# Patient Record
Sex: Female | Born: 1990 | Race: Black or African American | Hispanic: No | Marital: Single | Smoking: Never smoker
Health system: Southern US, Community
[De-identification: ages and names within clinical notes are randomized; demographics above are authoritative.]

## PROBLEM LIST (undated history)

## (undated) ENCOUNTER — Inpatient Hospital Stay (HOSPITAL_COMMUNITY): Payer: Self-pay

## (undated) ENCOUNTER — Ambulatory Visit (HOSPITAL_COMMUNITY): Admission: EM | Source: Home / Self Care

## (undated) ENCOUNTER — Emergency Department (HOSPITAL_COMMUNITY): Admission: EM | Payer: Medicaid Other | Source: Home / Self Care

## (undated) DIAGNOSIS — Z789 Other specified health status: Secondary | ICD-10-CM

## (undated) DIAGNOSIS — A749 Chlamydial infection, unspecified: Secondary | ICD-10-CM

## (undated) HISTORY — DX: Chlamydial infection, unspecified: A74.9

## (undated) HISTORY — PX: OTHER SURGICAL HISTORY: SHX169

## (undated) HISTORY — PX: DENTAL SURGERY: SHX609

---
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
## 2011-01-09 LAB — RAPID STREP SCREEN (MED CTR MEBANE ONLY): Streptococcus, Group A Screen (Direct): NEGATIVE

## 2011-04-16 ENCOUNTER — Inpatient Hospital Stay (INDEPENDENT_AMBULATORY_CARE_PROVIDER_SITE_OTHER)
Admission: RE | Admit: 2011-04-16 | Discharge: 2011-04-16 | Disposition: A | Discharge status: Home / Self Care | Payer: Self-pay | Source: Ambulatory Visit | Attending: Emergency Medicine | Admitting: Emergency Medicine

## 2011-04-16 DIAGNOSIS — L719 Rosacea, unspecified: Secondary | ICD-10-CM

## 2011-07-19 ENCOUNTER — Inpatient Hospital Stay (HOSPITAL_COMMUNITY)
Admission: RE | Admit: 2011-07-19 | Discharge: 2011-07-19 | Disposition: A | Discharge status: Home / Self Care | Payer: Medicaid Other | Source: Ambulatory Visit | Attending: Emergency Medicine | Admitting: Emergency Medicine

## 2011-08-25 ENCOUNTER — Encounter: Payer: Self-pay | Admitting: *Deleted

## 2011-08-25 ENCOUNTER — Emergency Department (INDEPENDENT_AMBULATORY_CARE_PROVIDER_SITE_OTHER)
Admission: EM | Admit: 2011-08-25 | Discharge: 2011-08-25 | Disposition: A | Discharge status: Home / Self Care | Payer: Medicaid Other | Source: Home / Self Care | Attending: Family Medicine | Admitting: Family Medicine

## 2011-08-25 DIAGNOSIS — S21009A Unspecified open wound of unspecified breast, initial encounter: Secondary | ICD-10-CM

## 2011-08-25 MED ORDER — AMOXICILLIN-POT CLAVULANATE 875-125 MG PO TABS: 1.0000 | ORAL_TABLET | Freq: Two times a day (BID) | ORAL | Status: AC
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
## 2012-03-06 ENCOUNTER — Encounter (HOSPITAL_COMMUNITY): Payer: Self-pay | Admitting: Emergency Medicine

## 2012-03-06 ENCOUNTER — Emergency Department (HOSPITAL_COMMUNITY)
Admission: EM | Admit: 2012-03-06 | Discharge: 2012-03-06 | Disposition: A | Discharge status: Home / Self Care | Payer: Medicaid Other | Attending: Emergency Medicine | Admitting: Emergency Medicine

## 2012-03-06 DIAGNOSIS — L0291 Cutaneous abscess, unspecified: Secondary | ICD-10-CM

## 2012-03-06 DIAGNOSIS — L0231 Cutaneous abscess of buttock: Secondary | ICD-10-CM | POA: Insufficient documentation

## 2012-03-06 MED ORDER — OXYCODONE-ACETAMINOPHEN 5-325 MG PO TABS: 2.0000 | ORAL_TABLET | Freq: Once | ORAL | Status: AC

## 2012-03-06 MED ORDER — CEPHALEXIN 500 MG PO CAPS: 500.0000 mg | ORAL_CAPSULE | Freq: Four times a day (QID) | ORAL | Status: AC

## 2012-03-06 MED ORDER — CEPHALEXIN 250 MG PO CAPS
500.0000 mg | ORAL_CAPSULE | Freq: Once | ORAL | Status: AC
  Administered 2012-03-06: 500 mg via ORAL
  Filled 2012-03-06: qty 2

## 2012-03-06 MED ORDER — OXYCODONE-ACETAMINOPHEN 5-325 MG PO TABS
2.0000 | ORAL_TABLET | Freq: Once | ORAL | Status: AC
  Administered 2012-03-06: 2 via ORAL
  Filled 2012-03-06 (×2): qty 1

## 2012-03-06 NOTE — ED Notes (Signed)
PT has swollen area size of a quarter on left buttock. Pt reports tender and very painful. No drainage noted.

## 2012-03-06 NOTE — ED Notes (Signed)
Pt feeling better

## 2012-03-06 NOTE — ED Provider Notes (Signed)
History     CSN: [id number]  Arrival date & time 03/06/12  0018   None     Chief Complaint  Patient presents with  . Abscess    (Consider location/radiation/quality/duration/timing/severity/associated sxs/prior treatment) HPI Comments: Patient with 3, days of painful right buttock surrounding erythema and no discrete bump or pimple has been taking over-the-counter ibuprofen, without relief.  Sitting, or palpation, increases, the pain and discomfort.  She has not had any trouble with ambulation.  Denies fever, or myalgias  Patient is a 21 y.o. female presenting with abscess. The history is provided by the patient.  Abscess  This is a new problem. The abscess is present on the right buttock. The problem is moderate. The abscess is characterized by redness and painfulness. The patient was exposed to OTC medications. The abscess first occurred at home. Pertinent negatives include no fever.    History reviewed. No pertinent past medical history.  History reviewed. No pertinent past surgical history.  No family history on file.  History  Substance Use Topics  . Smoking status: Not on file  . Smokeless tobacco: Not on file  . Alcohol Use: Yes     occasional    OB History    Grav Para Term Preterm Abortions TAB SAB Ect Mult Living                  Review of Systems  Constitutional: Negative for fever.  Musculoskeletal: Negative for myalgias.  Skin: Positive for wound.  Neurological: Negative for dizziness.    Allergies  Review of patient's allergies indicates no known allergies.  Home Medications   Current Outpatient Rx  Name Route Sig Dispense Refill  . ORTHO TRI-CYCLEN (28) PO Oral Take by mouth daily.        BP 112/90  Pulse 100  Temp(Src) 98.7 F (37.1 C) (Oral)  Resp 18  SpO2 98%  LMP 02/25/2012  Physical Exam  Constitutional: She appears well-developed and well-nourished.  HENT:  Head: Normocephalic.  Eyes: Pupils are equal, round, and reactive to  light.  Neck: Normal range of motion.  Cardiovascular: Normal rate.   Pulmonary/Chest: Effort normal.  Musculoskeletal: Normal range of motion.  Skin:       3 cm, oval on the right mid buttock area.  That is firm.  No discrete central lesion    ED Course  INCISION AND DRAINAGE Date/Time: 03/06/2012 2:44 AM Performed by: Arman Filter Authorized by: Arman Filter Consent: Verbal consent obtained. Risks and benefits: risks, benefits and alternatives were discussed Consent given by: patient Patient understanding: patient states understanding of the procedure being performed Patient identity confirmed: verbally with patient Time out: Immediately prior to procedure a "time out" was called to verify the correct patient, procedure, equipment, support staff and site/side marked as required. Type: abscess Body area: anogenital Location details: gluteal cleft Anesthesia: local infiltration Local anesthetic: lidocaine 2% without epinephrine Anesthetic total: 2 ml Scalpel size: 11 Needle gauge: 22 Incision type: single straight Complexity: simple Drainage: purulent Drainage amount: moderate Packing material: 1/4 in iodoform gauze Patient tolerance: Patient tolerated the procedure well with no immediate complications.   (including critical care time)  Labs Reviewed - No data to display No results found.   No diagnosis found.    MDM   Abscess of the right        Arman Filter, NP 03/06/12 0246  Arman Filter, NP 03/06/12 0246

## 2012-03-06 NOTE — ED Notes (Signed)
PT. REPORTS ABSCESS AT RIGHT BUTTOCK WITH NO DRAINAGE ONSET 3 DAYS AGO.

## 2012-03-06 NOTE — ED Notes (Signed)
Patient is resting comfortably. 

## 2012-03-06 NOTE — Discharge Instructions (Signed)
Abscess  Care After  An abscess (also called a boil or furuncle) is an infected area that contains a collection of pus. Signs and symptoms of an abscess include pain, tenderness, redness, or hardness, or you may feel a moveable soft area under your skin. An abscess can occur anywhere in the body. The infection may spread to surrounding tissues causing cellulitis. A cut (incision) by the surgeon was made over your abscess and the pus was drained out. Gauze may have been packed into the space to provide a drain that will allow the cavity to heal from the inside outwards. The boil may be painful for 5 to 7 days. Most people with a boil do not have high fevers. Your abscess, if seen early, may not have localized, and may not have been lanced. If not, another appointment may be required for this if it does not get better on its own or with medications.  HOME CARE INSTRUCTIONS     Only take over-the-counter or prescription medicines for pain, discomfort, or fever as directed by your caregiver.    When you bathe, soak and then remove gauze or iodoform packs at least daily or as directed by your caregiver. You may then wash the wound gently with mild soapy water. Repack with gauze or do as your caregiver directs.   SEEK IMMEDIATE MEDICAL CARE IF:     You develop increased pain, swelling, redness, drainage, or bleeding in the wound site.    You develop signs of generalized infection including muscle aches, chills, fever, or a general ill feeling.    An oral temperature above 102 F (38.9 C) develops, not controlled by medication.   See your caregiver for a recheck if you develop any of the symptoms described above. If medications (antibiotics) were prescribed, take them as directed.  Document Released: 04/04/2005 Document Revised: 09/05/2011 Document Reviewed: 11/30/2007  ExitCare Patient Information 2012 [employer].

## 2012-03-06 NOTE — ED Notes (Signed)
PT ambulated with a  Steady gait; VSS; A&Ox3; no signs of distress; respirations even and unlabored; skin warm and dry; no questions at this time.  

## 2012-03-06 NOTE — ED Provider Notes (Signed)
Medical screening examination/treatment/procedure(s) were performed by non-physician practitioner and as supervising physician I was immediately available for consultation/collaboration.   Lyanne Co, MD 03/06/12 [phone]

## 2012-03-07 ENCOUNTER — Emergency Department (HOSPITAL_COMMUNITY)
Admission: EM | Admit: 2012-03-07 | Discharge: 2012-03-07 | Disposition: A | Discharge status: Home / Self Care | Payer: Medicaid Other | Attending: Emergency Medicine | Admitting: Emergency Medicine

## 2012-03-07 ENCOUNTER — Encounter (HOSPITAL_COMMUNITY): Payer: Self-pay | Admitting: Emergency Medicine

## 2012-03-07 DIAGNOSIS — Z79899 Other long term (current) drug therapy: Secondary | ICD-10-CM | POA: Insufficient documentation

## 2012-03-07 DIAGNOSIS — Z792 Long term (current) use of antibiotics: Secondary | ICD-10-CM | POA: Insufficient documentation

## 2012-03-07 DIAGNOSIS — L0291 Cutaneous abscess, unspecified: Secondary | ICD-10-CM

## 2012-03-07 DIAGNOSIS — L03317 Cellulitis of buttock: Secondary | ICD-10-CM | POA: Insufficient documentation

## 2012-03-07 DIAGNOSIS — L0231 Cutaneous abscess of buttock: Secondary | ICD-10-CM | POA: Insufficient documentation

## 2012-03-07 DIAGNOSIS — Z09 Encounter for follow-up examination after completed treatment for conditions other than malignant neoplasm: Secondary | ICD-10-CM | POA: Insufficient documentation

## 2012-03-07 NOTE — Discharge Instructions (Signed)
Soak in a warm tub, 3-4 times a day for 20 minutes. After that clean the area well with soap and water. Continue this until the wound is completely healed. Take the antibiotics until they are gone. See the Dr. of your choice for problems.  Abscess An abscess (boil or furuncle) is an infected area that contains a collection of pus.  SYMPTOMS Signs and symptoms of an abscess include pain, tenderness, redness, or hardness. You may feel a moveable soft area under your skin. An abscess can occur anywhere in the body.  TREATMENT  A surgical cut (incision) may be made over your abscess to drain the pus. Gauze may be packed into the space or a drain may be looped through the abscess cavity (pocket). This provides a drain that will allow the cavity to heal from the inside outwards. The abscess may be painful for a few days, but should feel much better if it was drained.  Your abscess, if seen early, may not have localized and may not have been drained. If not, another appointment may be required if it does not get better on its own or with medications. HOME CARE INSTRUCTIONS   Only take over-the-counter or prescription medicines for pain, discomfort, or fever as directed by your caregiver.   Take your antibiotics as directed if they were prescribed. Finish them even if you start to feel better.   Keep the skin and clothes clean around your abscess.   If the abscess was drained, you will need to use gauze dressing to collect any draining pus. Dressings will typically need to be changed 3 or more times a day.   The infection may spread by skin contact with others. Avoid skin contact as much as possible.   Practice good hygiene. This includes regular hand washing, cover any draining skin lesions, and do not share personal care items.   If you participate in sports, do not share athletic equipment, towels, whirlpools, or personal care items. Shower after every practice or tournament.   If a draining  area cannot be adequately covered:   Do not participate in sports.   Children should not participate in day care until the wound has healed or drainage stops.   If your caregiver has given you a follow-up appointment, it is very important to keep that appointment. Not keeping the appointment could result in a much worse infection, chronic or permanent injury, pain, and disability. If there is any problem keeping the appointment, you must call back to this facility for assistance.  SEEK MEDICAL CARE IF:   You develop increased pain, swelling, redness, drainage, or bleeding in the wound site.   You develop signs of generalized infection including muscle aches, chills, fever, or a general ill feeling.   You have an oral temperature above 102 F (38.9 C).  MAKE SURE YOU:   Understand these instructions.   Will watch your condition.   Will get help right away if you are not doing well or get worse.  Document Released: 06/26/2005 Document Revised: 09/05/2011 Document Reviewed: 04/19/2008 [facility] Patient Information 2012 [place], [place].   RESOURCE GUIDE  Chronic Pain Problems: Contact [facility]  [phone] Patients need to be referred by their primary care doctor.  Insufficient Money for Medicine: Contact United Way:  call "211" or [facility] 346-764-3687.  No Primary Care Doctor: - Call [facility] Connect  [phone] - can help you locate a primary care doctor that  accepts your insurance, provides certain  services, etc. - Physician Referral [phone]  Agencies that provide inexpensive medical care: - [facility]  [phone] - [facility]  [phone] - [facility]  [phone] [facility]  [phone] - [facility]  [phone] [facility]  [phone]  Medicaid-accepting [facility] Providers: - [facility]- [address]  [phone], Mon-Fri  9am-7pm, Sat 9am-1pm - [facility]- [address] [place], [address] [place]  [phone] - [facility]- [address], [address] [place]  [phone] [facility]- [address]  [phone] - Renaye Rakers- [address] [place], [address], [phone]  Only accepts [place] patients after they have their name  applied to their card  Self Pay (no insurance) in [place]: - Sickle Cell Patients: Dr Willey Blade, [facility]  [address] [place], [phone] - [facility]- [address] [place]  [phone]       [facility]- [address]       -     [facility]- see information above (Speak to [employer] if you do not have insurance)       -  [facility]- [address] [place], [phone]       -  [facility]- [address],  [phone]       -  [facility]- [address], [phone]       -  Dr Julio Sicks-  [address], [place], [phone]       -  [facility]- [address], [phone]       -  [facility]- [address], [phone], also [address], [phone]       -    [facility]- [address] [place], [phone], 1st & 3rd Saturday   every month, 10am-1pm  1) Find a Doctor and Pay Out of Pocket Although you won't have to find out who is covered by your insurance plan, it is a good idea to ask around and get recommendations. You will then need to call the office and see if the doctor you have chosen will accept you as a new patient and what types of options they offer for patients who are self-pay. Some doctors offer discounts or will set up payment plans for their patients who do not have insurance, but you will need to ask so you aren't surprised when you get to your appointment.  2) Contact Your Local Health Department Not all health departments have doctors that can see  patients for sick visits, but many do, so it is worth a call to see if yours does. If you don't know where your local health department is, you can check in your phone book. The CDC also has a tool to help you locate your state's health department, and many state websites also have listings of all of their local health departments.  3) Find a Walk-in Clinic If your illness is not likely to be very severe or complicated, you may want to try a walk in clinic. These are popping up all over the country in pharmacies, drugstores, and shopping centers. They're usually staffed by nurse practitioners or physician assistants that have been trained to treat common illnesses  and complaints. They're usually fairly quick and inexpensive. However, if you have serious medical issues or chronic medical problems, these are probably not your best option  STD Testing - [facility] Department of [facility] [place], STD Clinic, [address] [place], phone [phone] or [phone].  Monday - Friday, call for an appointment. [facility] Department of [employer], STD Clinic, [address], [place], phone [phone] or [phone].  Monday - Friday, call for an appointment.  Abuse/Neglect: [facility] Child Abuse Hotline [phone] [facility] Child Abuse Hotline 941-719-8420 (After Hours)  Emergency Shelter:  [facility] [phone]  Maternity Homes: - [facility] [phone]) [phone] - [facility] [phone]  MRSA Hotline #:   2150995169  [facility] Resources  [facility] [place]  [employer] [facility] Dept. [address]                 [address]         [address] [place] [address]  [place]ondell Reveal Phone:  [phone]                                  Phone:  [phone]                    Phone:  [phone]  [facility] Mental Health, [phone] - [phone]       -     [facility] in [place], [address]                                  (916)742-0526, [facility] Child Abuse Hotline 331-875-3653 or (708)887-5880 (After Hours)   Behavioral Health Services  Substance Abuse Resources: - Alcohol and Drug Services  646-564-0860 - [facility] [phone] - [facility] [place] [phone] Floydene Flock [phone] - Residential & Outpatient Substance Abuse Program  [phone]  Psychological Services: [facility]  [phone] Services  7204939816 - [facility], [phone] [place]. [address], [place], ACCESS LINE: 947-042-0880 or 816-472-8861, [employer]  Dental Assistance  If unable to pay or uninsured, contact:  [facility] or [facility]. to become qualified for the adult dental clinic.  Patients with Medicaid: [facility] [phone] [address], [phone] [address] [phone]  If unable to pay, or uninsured, contact [facility] 603-573-2852) or [facility] Department [phone] in [place], [phone] in [facility]) to become qualified for the adult dental clinic  Other Low-Cost Community Dental Services: - [facility]- [address] [place], [place], [place], [address], [phone], [phone], 2nd and 4th Thursday of the month at 6:30am.  10 clients each day by appointment, can sometimes see walk-in  patients if someone does not show for an appointment. [facility]- [address] [place], [place], [address], [phone] - [facility]- [address], [place], [place], [address], [phone] - [place] [facility]- [phone] [facility] Department-  [phone] [facility] Department- [phone]

## 2012-03-07 NOTE — ED Provider Notes (Signed)
History   This chart was scribed for Flint Melter, MD by Shari Heritage. The patient was seen in room STRE4/STRE4. Patient's care was started at 1107.     CSN: [id number]  Arrival date & time 03/07/12  1107   First MD Initiated Contact with Patient 03/07/12 1136      Chief Complaint  Patient presents with  . Wound Check    (Consider location/radiation/quality/duration/timing/severity/associated sxs/prior treatment) Patient is a 21 y.o. female presenting with wound check. The history is provided by the patient. No language interpreter was used.  Wound Check    Patricia Villanueva is a 21 y.o.  who presents to the Emergency Department complaining of moderate pain after a boil on her buttocks was incised and drained. A physician had placed a wick at the area of the abscess and wound has been draining for 2 days. Patient has been taking Percocet and antibiotics, but still experiences pain. Patient reports no other pertinent medical or surgical history.    History reviewed. No pertinent past medical history.  History reviewed. No pertinent past surgical history.  No family history on file.  History  Substance Use Topics  . Smoking status: Not on file  . Smokeless tobacco: Not on file  . Alcohol Use: Yes     occasional    OB History    Grav Para Term Preterm Abortions TAB SAB Ect Mult Living                  Review of Systems A complete 10 system review of systems was obtained and all systems are negative except as noted in the HPI and PMH.   Allergies  Review of patient's allergies indicates no known allergies.  Home Medications   Current Outpatient Rx  Name Route Sig Dispense Refill  . IBUPROFEN 600 MG PO TABS Oral Take 600 mg by mouth every 6 (six) hours as needed. For cramps    . NORGESTIM-ETH ESTRAD TRIPHASIC 0.18/0.215/0.25 MG-35 MCG PO TABS Oral Take 1 tablet by mouth daily.    . OXYCODONE-ACETAMINOPHEN 5-325 MG PO TABS Oral Take 1 tablet by mouth every 6 (six)  hours as needed. For pain    . CEPHALEXIN 500 MG PO CAPS Oral Take 1 capsule (500 mg total) by mouth 4 (four) times daily. 28 capsule 0  . OXYCODONE-ACETAMINOPHEN 5-325 MG PO TABS Oral Take 2 tablets by mouth once. 20 tablet 0    BP 96/44  Pulse 82  Temp(Src) 98 F (36.7 C) (Oral)  Resp 16  SpO2 100%  LMP 02/25/2012  Physical Exam  Nursing note and vitals reviewed. Constitutional: She is oriented to person, place, and time. She appears well-developed and well-nourished. No distress.  HENT:  Head: Normocephalic and atraumatic.  Eyes: Conjunctivae and EOM are normal.  Neck: Neck supple. No tracheal deviation present.  Cardiovascular: Normal rate.   Pulmonary/Chest: Effort normal. No respiratory distress.  Abdominal: She exhibits no distension.  Musculoskeletal: Normal range of motion.  Neurological: She is alert and oriented to person, place, and time. No sensory deficit.  Skin: Skin is dry.       Removed 2.5in wick from wound. Wound is draining mild purulence and there is surrounding induration of 2cm. Area is mildly tender.  Psychiatric: She has a normal mood and affect. Her behavior is normal.    ED Course  Procedures (including critical care time)  COORDINATION OF CARE: 11:50AM - Patient informed of current plan for treatment and evaluation and agrees with  plan at this time. Suggested that patient soak in a bath to release wick and sooth area. Also reccommended that patient continue to take pain medications and antibiotics.    Labs Reviewed - No data to display No results found.   1. Abscess       MDM  Healing, buttocks, abscess, no indication for recurrent packing. Doubt cellulitis. Doubt metabolic instability, serious bacterial infection or impending vascular collapse; the patient is stable for discharge.   Plan: Home Medications- keflex; Home Treatments- heat; Recommended follow up- PCP prn   I personally performed the services described in this  documentation, which was scribed in my presence. The recorded information has been reviewed and considered.  [id number]  Flint Melter, MD 03/07/12 2037

## 2012-03-07 NOTE — ED Notes (Signed)
Pt. Stated, I'm here to remove the packing from my bottom

## 2012-03-18 ENCOUNTER — Other Ambulatory Visit: Payer: Self-pay

## 2012-03-18 ENCOUNTER — Emergency Department (HOSPITAL_COMMUNITY)
Admission: EM | Admit: 2012-03-18 | Discharge: 2012-03-18 | Disposition: A | Discharge status: Home / Self Care | Payer: Medicaid Other | Attending: Emergency Medicine | Admitting: Emergency Medicine

## 2012-03-18 ENCOUNTER — Encounter (HOSPITAL_COMMUNITY): Payer: Self-pay

## 2012-03-18 ENCOUNTER — Emergency Department (HOSPITAL_COMMUNITY): Payer: Medicaid Other

## 2012-03-18 DIAGNOSIS — S41109A Unspecified open wound of unspecified upper arm, initial encounter: Secondary | ICD-10-CM | POA: Insufficient documentation

## 2012-03-18 DIAGNOSIS — S41112A Laceration without foreign body of left upper arm, initial encounter: Secondary | ICD-10-CM

## 2012-03-18 LAB — URINALYSIS, ROUTINE W REFLEX MICROSCOPIC
Glucose, UA: NEGATIVE mg/dL
Leukocytes, UA: NEGATIVE
Protein, ur: NEGATIVE mg/dL
Specific Gravity, Urine: 1.013 (ref 1.005–1.030)
Urobilinogen, UA: 0.2 mg/dL (ref 0.0–1.0)

## 2012-03-18 LAB — BASIC METABOLIC PANEL
CO2: 23 mEq/L (ref 19–32)
Calcium: 9.2 mg/dL (ref 8.4–10.5)
GFR calc non Af Amer: >90 mL/min (ref >90)
Potassium: 4.3 mEq/L (ref 3.5–5.1)
Sodium: 139 mEq/L (ref 135–145)

## 2012-03-18 LAB — DIFFERENTIAL
Basophils Absolute: 0 10*3/uL (ref 0.0–0.1)
Eosinophils Relative: 3 % (ref 0–5)
Lymphocytes Relative: 36 % (ref 12–46)
Lymphs Abs: 3.3 10*3/uL (ref 0.7–4.0)
Neutrophils Relative %: 55 % (ref 43–77)

## 2012-03-18 LAB — CBC
MCV: 81.9 fL (ref 78.0–100.0)
Platelets: 246 10*3/uL (ref 150–400)
RBC: 4.97 MIL/uL (ref 3.87–5.11)
RDW: 12.1 % (ref 11.5–15.5)
WBC: 9.1 10*3/uL (ref 4.0–10.5)

## 2012-03-18 LAB — POCT PREGNANCY, URINE: Preg Test, Ur: NEGATIVE

## 2012-03-18 MED ORDER — CEPHALEXIN 500 MG PO CAPS: 500.0000 mg | ORAL_CAPSULE | Freq: Four times a day (QID) | ORAL | Status: DC

## 2012-03-18 MED ORDER — MORPHINE SULFATE 4 MG/ML IJ SOLN
4.0000 mg | Freq: Once | INTRAMUSCULAR | Status: DC
  Filled 2012-03-18: qty 1

## 2012-03-18 MED ORDER — ONDANSETRON HCL 4 MG/2ML IJ SOLN
4.0000 mg | Freq: Once | INTRAMUSCULAR | Status: DC
  Filled 2012-03-18: qty 2

## 2012-03-18 MED ORDER — TETANUS-DIPHTH-ACELL PERTUSSIS 5-2.5-18.5 LF-MCG/0.5 IM SUSP
0.5000 mL | Freq: Once | INTRAMUSCULAR | Status: DC
  Filled 2012-03-18: qty 0.5

## 2012-03-18 MED ORDER — HYDROCODONE-ACETAMINOPHEN 5-325 MG PO TABS: 1.0000 | ORAL_TABLET | ORAL | Status: DC | PRN

## 2012-03-18 MED ORDER — SODIUM CHLORIDE 0.9 % IV SOLN: INTRAVENOUS | Status: DC

## 2012-03-18 NOTE — ED Notes (Signed)
Pt moved to room 4; approximately 1 inch laceration to upper left arm; 4x4 gauze placed over it and taped
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
## 2012-03-18 NOTE — ED Notes (Signed)
Pt back from Radiology Dept; placed on monitor, continuous pulse oximetry and blood pressure cuff; EKG performed; family at bedside
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
## 2012-03-18 NOTE — ED Notes (Signed)
Patient taken to radiology for xrays to chest and arm.

## 2012-03-18 NOTE — ED Notes (Signed)
Pt up ambulatory to the bathroom at this time to attempt to provide an urine specimen 

## 2012-03-18 NOTE — ED Notes (Signed)
Pt still in Radiology Dept at this time.

## 2012-03-18 NOTE — Progress Notes (Signed)
Orthopedic Tech Progress Note Patient Details:  Patricia Villanueva 14-Feb-1991 [id number]  Ortho Devices Type of Ortho Device: Arm foam sling Ortho Device/Splint Location: left arm Ortho Device/Splint Interventions: Application   Nikki Dom 03/18/2012, 9:38 PM

## 2012-03-26 ENCOUNTER — Encounter (HOSPITAL_COMMUNITY): Payer: Self-pay | Admitting: Pharmacy Technician

## 2012-03-31 ENCOUNTER — Encounter (HOSPITAL_COMMUNITY): Payer: Self-pay

## 2012-03-31 ENCOUNTER — Encounter (HOSPITAL_COMMUNITY)
Admission: RE | Admit: 2012-03-31 | Discharge: 2012-03-31 | Disposition: A | Discharge status: Home / Self Care | Payer: Medicaid Other | Source: Ambulatory Visit | Attending: Orthopedic Surgery | Admitting: Orthopedic Surgery

## 2012-03-31 LAB — SURGICAL PCR SCREEN
MRSA, PCR: POSITIVE — AB
Staphylococcus aureus: POSITIVE — AB

## 2012-03-31 MED ORDER — CEFAZOLIN SODIUM-DEXTROSE 2-3 GM-% IV SOLR
2.0000 g | INTRAVENOUS | Status: DC
  Filled 2012-03-31: qty 50

## 2012-03-31 MED ORDER — CHLORHEXIDINE GLUCONATE 4 % EX LIQD: 60.0000 mL | Freq: Once | CUTANEOUS | Status: DC

## 2012-03-31 NOTE — Pre-Procedure Instructions (Signed)
21 [place] T Xxxneal  03/31/2012   Your procedure is scheduled on:  Wed, July 3 @ 3:15 PM  Report to [facility] at 1:15 PM.  Call this number if you have problems the morning of surgery: [phone]   Remember:   Do not eat food:After Midnight.    Take these medicines the morning of surgery with A SIP OF WATER: Pain Pill(if needed) and Keflex(Cephalexin)   Do not wear jewelry, make-up or nail polish.  Do not wear lotions, powders, or perfumes.   Do not shave 48 hours prior to surgery.   Do not bring valuables to the hospital.  Contacts, dentures or bridgework may not be worn into surgery.  Leave suitcase in the car. After surgery it may be brought to your room.  For patients admitted to the hospital, checkout time is 11:00 AM the day of discharge.   Patients discharged the day of surgery will not be allowed to drive home.  Special Instructions: CHG Shower Use Special Wash: 1/2 bottle night before surgery and 1/2 bottle morning of surgery.   Please read over the following fact sheets that you were given: Pain Booklet, Coughing and Deep Breathing, MRSA Information and Surgical Site Infection Prevention

## 2012-03-31 NOTE — Progress Notes (Signed)
Dr.Blunt in [place] is medical mD  Recent ekg and cxr in epic

## 2012-04-01 ENCOUNTER — Encounter (HOSPITAL_COMMUNITY)
Admission: RE | Disposition: A | Discharge status: Home / Self Care | Payer: Self-pay | Source: Ambulatory Visit | Attending: Orthopedic Surgery

## 2012-04-01 ENCOUNTER — Encounter (HOSPITAL_COMMUNITY): Payer: Self-pay

## 2012-04-01 ENCOUNTER — Ambulatory Visit (HOSPITAL_COMMUNITY)
Admission: RE | Admit: 2012-04-01 | Discharge: 2012-04-01 | Disposition: A | Discharge status: Home / Self Care | Payer: Medicaid Other | Source: Ambulatory Visit | Attending: Orthopedic Surgery | Admitting: Orthopedic Surgery

## 2012-04-01 DIAGNOSIS — Z01812 Encounter for preprocedural laboratory examination: Secondary | ICD-10-CM | POA: Insufficient documentation

## 2012-04-01 DIAGNOSIS — S4490XA Injury of unspecified nerve at shoulder and upper arm level, unspecified arm, initial encounter: Secondary | ICD-10-CM | POA: Insufficient documentation

## 2012-04-01 DIAGNOSIS — X58XXXA Exposure to other specified factors, initial encounter: Secondary | ICD-10-CM | POA: Insufficient documentation

## 2012-04-01 SURGERY — IRRIGATION AND DEBRIDEMENT EXTREMITY
Anesthesia: General

## 2012-04-01 MED ORDER — MUPIROCIN 2 % EX OINT
TOPICAL_OINTMENT | CUTANEOUS | Status: AC
  Administered 2012-04-01: 1 via NASAL
  Filled 2012-04-01: qty 22

## 2012-04-01 SURGICAL SUPPLY — 52 items
BANDAGE CONFORM 2  STR LF (GAUZE/BANDAGES/DRESSINGS) IMPLANT
BANDAGE ELASTIC 3 VELCRO ST LF (GAUZE/BANDAGES/DRESSINGS) ×3 IMPLANT
BANDAGE ELASTIC 4 VELCRO ST LF (GAUZE/BANDAGES/DRESSINGS) ×3 IMPLANT
BANDAGE GAUZE ELAST BULKY 4 IN (GAUZE/BANDAGES/DRESSINGS) ×3 IMPLANT
BNDG CMPR 9X4 STRL LF SNTH (GAUZE/BANDAGES/DRESSINGS) ×1
BNDG COHESIVE 1X5 TAN STRL LF (GAUZE/BANDAGES/DRESSINGS) IMPLANT
BNDG ESMARK 4X9 LF (GAUZE/BANDAGES/DRESSINGS) ×3 IMPLANT
CLOTH BEACON ORANGE TIMEOUT ST (SAFETY) ×3 IMPLANT
CORDS BIPOLAR (ELECTRODE) ×3 IMPLANT
COVER SURGICAL LIGHT HANDLE (MISCELLANEOUS) ×3 IMPLANT
CUFF TOURNIQUET SINGLE 18IN (TOURNIQUET CUFF) ×3 IMPLANT
CUFF TOURNIQUET SINGLE 24IN (TOURNIQUET CUFF) IMPLANT
DRAIN PENROSE 1/4X12 LTX STRL (WOUND CARE) IMPLANT
DRAPE SURG 17X23 STRL (DRAPES) ×3 IMPLANT
DRSG ADAPTIC 3X8 NADH LF (GAUZE/BANDAGES/DRESSINGS) ×3 IMPLANT
ELECT REM PT RETURN 9FT ADLT (ELECTROSURGICAL)
ELECTRODE REM PT RTRN 9FT ADLT (ELECTROSURGICAL) IMPLANT
GAUZE XEROFORM 1X8 LF (GAUZE/BANDAGES/DRESSINGS) ×3 IMPLANT
GAUZE XEROFORM 5X9 LF (GAUZE/BANDAGES/DRESSINGS) IMPLANT
GLOVE BIOGEL PI IND STRL 8.5 (GLOVE) ×2 IMPLANT
GLOVE BIOGEL PI INDICATOR 8.5 (GLOVE) ×1
GLOVE SURG ORTHO 8.0 STRL STRW (GLOVE) ×3 IMPLANT
GOWN PREVENTION PLUS XLARGE (GOWN DISPOSABLE) ×3 IMPLANT
GOWN STRL NON-REIN LRG LVL3 (GOWN DISPOSABLE) ×9 IMPLANT
HANDPIECE INTERPULSE COAX TIP (DISPOSABLE)
KIT BASIN OR (CUSTOM PROCEDURE TRAY) ×3 IMPLANT
KIT ROOM TURNOVER OR (KITS) ×3 IMPLANT
MANIFOLD NEPTUNE II (INSTRUMENTS) ×3 IMPLANT
NDL HYPO 25GX1X1/2 BEV (NEEDLE) IMPLANT
NEEDLE HYPO 25GX1X1/2 BEV (NEEDLE) IMPLANT
NS IRRIG 1000ML POUR BTL (IV SOLUTION) ×3 IMPLANT
PACK ORTHO EXTREMITY (CUSTOM PROCEDURE TRAY) ×3 IMPLANT
PAD ARMBOARD 7.5X6 YLW CONV (MISCELLANEOUS) ×6 IMPLANT
PAD CAST 4YDX4 CTTN HI CHSV (CAST SUPPLIES) ×2 IMPLANT
PADDING CAST COTTON 4X4 STRL (CAST SUPPLIES) ×2
SET HNDPC FAN SPRY TIP SCT (DISPOSABLE) IMPLANT
SOAP 2 % CHG 4 OZ (WOUND CARE) ×3 IMPLANT
SPONGE GAUZE 4X4 12PLY (GAUZE/BANDAGES/DRESSINGS) ×3 IMPLANT
SPONGE LAP 18X18 X RAY DECT (DISPOSABLE) ×3 IMPLANT
SPONGE LAP 4X18 X RAY DECT (DISPOSABLE) ×3 IMPLANT
SUCTION FRAZIER TIP 10 FR DISP (SUCTIONS) ×3 IMPLANT
SUT ETHILON 4 0 PS 2 18 (SUTURE) IMPLANT
SUT ETHILON 5 0 P 3 18 (SUTURE) ×1
SUT NYLON ETHILON 5-0 P-3 1X18 (SUTURE) ×2 IMPLANT
SYR CONTROL 10ML LL (SYRINGE) IMPLANT
TOWEL OR 17X24 6PK STRL BLUE (TOWEL DISPOSABLE) ×3 IMPLANT
TOWEL OR 17X26 10 PK STRL BLUE (TOWEL DISPOSABLE) ×3 IMPLANT
TUBE ANAEROBIC SPECIMEN COL (MISCELLANEOUS) IMPLANT
TUBE CONNECTING 12X1/4 (SUCTIONS) ×3 IMPLANT
UNDERPAD 30X30 INCONTINENT (UNDERPADS AND DIAPERS) ×3 IMPLANT
WATER STERILE IRR 1000ML POUR (IV SOLUTION) ×3 IMPLANT
YANKAUER SUCT BULB TIP NO VENT (SUCTIONS) ×3 IMPLANT

## 2012-04-04 ENCOUNTER — Encounter (HOSPITAL_COMMUNITY)
Admission: RE | Disposition: A | Discharge status: Home / Self Care | Payer: Self-pay | Source: Ambulatory Visit | Attending: Orthopedic Surgery

## 2012-04-04 ENCOUNTER — Encounter (HOSPITAL_COMMUNITY): Payer: Self-pay | Admitting: Anesthesiology

## 2012-04-04 ENCOUNTER — Encounter (HOSPITAL_COMMUNITY): Payer: Self-pay | Admitting: *Deleted

## 2012-04-04 ENCOUNTER — Ambulatory Visit (HOSPITAL_COMMUNITY)
Admission: RE | Admit: 2012-04-04 | Discharge: 2012-04-04 | Disposition: A | Discharge status: Home / Self Care | Payer: Medicaid Other | Source: Ambulatory Visit | Attending: Orthopedic Surgery | Admitting: Orthopedic Surgery

## 2012-04-04 ENCOUNTER — Ambulatory Visit (HOSPITAL_COMMUNITY): Payer: Medicaid Other | Admitting: Anesthesiology

## 2012-04-04 DIAGNOSIS — S5420XA Injury of radial nerve at forearm level, unspecified arm, initial encounter: Secondary | ICD-10-CM | POA: Insufficient documentation

## 2012-04-04 DIAGNOSIS — Y998 Other external cause status: Secondary | ICD-10-CM | POA: Insufficient documentation

## 2012-04-04 DIAGNOSIS — S41109A Unspecified open wound of unspecified upper arm, initial encounter: Secondary | ICD-10-CM | POA: Insufficient documentation

## 2012-04-04 HISTORY — PX: NERVE EXPLORATION: SHX2082

## 2012-04-04 SURGERY — EXPLORATION, NERVE
Anesthesia: General | Site: Arm Upper | Laterality: Left | Wound class: Clean

## 2012-04-04 MED ORDER — HYDROMORPHONE HCL PF 1 MG/ML IJ SOLN
0.2500 mg | INTRAMUSCULAR | Status: DC | PRN
  Administered 2012-04-04 (×2): 0.5 mg via INTRAVENOUS

## 2012-04-04 MED ORDER — LACTATED RINGERS IV SOLN
INTRAVENOUS | Status: DC | PRN
  Administered 2012-04-04: 09:00:00 via INTRAVENOUS

## 2012-04-04 MED ORDER — ONDANSETRON HCL 4 MG/2ML IJ SOLN
4.0000 mg | Freq: Once | INTRAMUSCULAR | Status: AC | PRN
  Administered 2012-04-04: 4 mg via INTRAVENOUS

## 2012-04-04 MED ORDER — METHOCARBAMOL 500 MG PO TABS: 500.0000 mg | ORAL_TABLET | Freq: Four times a day (QID) | ORAL | Status: AC

## 2012-04-04 MED ORDER — BUPIVACAINE HCL (PF) 0.25 % IJ SOLN
INTRAMUSCULAR | Status: AC
  Filled 2012-04-04: qty 30

## 2012-04-04 MED ORDER — ONDANSETRON HCL 4 MG/2ML IJ SOLN
INTRAMUSCULAR | Status: AC
  Administered 2012-04-04: 4 mg via INTRAVENOUS
  Filled 2012-04-04: qty 2

## 2012-04-04 MED ORDER — PROPOFOL 10 MG/ML IV EMUL
INTRAVENOUS | Status: DC | PRN
  Administered 2012-04-04: 150 mg via INTRAVENOUS

## 2012-04-04 MED ORDER — MUPIROCIN 2 % EX OINT
TOPICAL_OINTMENT | Freq: Two times a day (BID) | CUTANEOUS | Status: DC
  Administered 2012-04-04: 1 via NASAL
  Filled 2012-04-04: qty 22

## 2012-04-04 MED ORDER — ACETAMINOPHEN 10 MG/ML IV SOLN: 1000.0000 mg | Freq: Once | INTRAVENOUS | Status: DC | PRN

## 2012-04-04 MED ORDER — HYDROMORPHONE HCL PF 1 MG/ML IJ SOLN
INTRAMUSCULAR | Status: AC
  Administered 2012-04-04: 0.5 mg via INTRAVENOUS
  Filled 2012-04-04: qty 1

## 2012-04-04 MED ORDER — MIDAZOLAM HCL 5 MG/5ML IJ SOLN
INTRAMUSCULAR | Status: DC | PRN
  Administered 2012-04-04: 2 mg via INTRAVENOUS

## 2012-04-04 MED ORDER — FENTANYL CITRATE 0.05 MG/ML IJ SOLN
INTRAMUSCULAR | Status: DC | PRN
  Administered 2012-04-04: 100 ug via INTRAVENOUS
  Administered 2012-04-04: 50 ug via INTRAVENOUS

## 2012-04-04 MED ORDER — CEFAZOLIN SODIUM 1-5 GM-% IV SOLN
INTRAVENOUS | Status: DC | PRN
  Administered 2012-04-04: 2 g via INTRAVENOUS

## 2012-04-04 MED ORDER — DOCUSATE SODIUM 100 MG PO CAPS: 100.0000 mg | ORAL_CAPSULE | Freq: Two times a day (BID) | ORAL | Status: AC

## 2012-04-04 MED ORDER — OXYCODONE-ACETAMINOPHEN 10-325 MG PO TABS: 1.0000 | ORAL_TABLET | ORAL | Status: AC | PRN

## 2012-04-04 MED ORDER — LIDOCAINE HCL (CARDIAC) 20 MG/ML IV SOLN
INTRAVENOUS | Status: DC | PRN
  Administered 2012-04-04: 50 mg via INTRAVENOUS

## 2012-04-04 MED ORDER — 0.9 % SODIUM CHLORIDE (POUR BTL) OPTIME
TOPICAL | Status: DC | PRN
  Administered 2012-04-04: 1000 mL

## 2012-04-04 SURGICAL SUPPLY — 52 items
BANDAGE ELASTIC 3 VELCRO ST LF (GAUZE/BANDAGES/DRESSINGS) IMPLANT
BANDAGE ELASTIC 4 VELCRO ST LF (GAUZE/BANDAGES/DRESSINGS) ×1 IMPLANT
BANDAGE GAUZE ELAST BULKY 4 IN (GAUZE/BANDAGES/DRESSINGS) ×1 IMPLANT
BNDG COHESIVE 1X5 TAN STRL LF (GAUZE/BANDAGES/DRESSINGS) IMPLANT
CLOTH BEACON ORANGE TIMEOUT ST (SAFETY) ×2 IMPLANT
CORDS BIPOLAR (ELECTRODE) ×2 IMPLANT
COVER SURGICAL LIGHT HANDLE (MISCELLANEOUS) ×2 IMPLANT
CUFF TOURNIQUET SINGLE 18IN (TOURNIQUET CUFF) ×1 IMPLANT
CUFF TOURNIQUET SINGLE 24IN (TOURNIQUET CUFF) IMPLANT
DRAIN PENROSE 1/4X12 LTX STRL (WOUND CARE) ×1 IMPLANT
DRAPE MICROSCOPE LEICA (MISCELLANEOUS) ×1 IMPLANT
DRAPE OEC MINIVIEW 54X84 (DRAPES) IMPLANT
DRAPE SURG 17X23 STRL (DRAPES) ×1 IMPLANT
DRSG ADAPTIC 3X8 NADH LF (GAUZE/BANDAGES/DRESSINGS) ×1 IMPLANT
GAUZE SPONGE 2X2 8PLY STRL LF (GAUZE/BANDAGES/DRESSINGS) IMPLANT
GLOVE BIOGEL PI IND STRL 8.5 (GLOVE) ×1 IMPLANT
GLOVE BIOGEL PI INDICATOR 8.5 (GLOVE) ×1
GLOVE SURG ORTHO 8.0 STRL STRW (GLOVE) ×2 IMPLANT
GOWN PREVENTION PLUS XLARGE (GOWN DISPOSABLE) ×4 IMPLANT
GOWN STRL NON-REIN LRG LVL3 (GOWN DISPOSABLE) ×2 IMPLANT
KIT BASIN OR (CUSTOM PROCEDURE TRAY) ×2 IMPLANT
KIT ROOM TURNOVER OR (KITS) ×2 IMPLANT
MANIFOLD NEPTUNE II (INSTRUMENTS) ×1 IMPLANT
NDL HYPO 25GX1X1/2 BEV (NEEDLE) IMPLANT
NEEDLE HYPO 25GX1X1/2 BEV (NEEDLE) ×2 IMPLANT
NS IRRIG 1000ML POUR BTL (IV SOLUTION) ×2 IMPLANT
PACK ORTHO EXTREMITY (CUSTOM PROCEDURE TRAY) ×2 IMPLANT
PAD ARMBOARD 7.5X6 YLW CONV (MISCELLANEOUS) ×4 IMPLANT
PAD CAST 4YDX4 CTTN HI CHSV (CAST SUPPLIES) IMPLANT
PADDING CAST COTTON 4X4 STRL (CAST SUPPLIES) ×2
PENCIL BUTTON HOLSTER BLD 10FT (ELECTRODE) ×1 IMPLANT
PROTECTOR NERVE AXOGUARD 7X40 (Tissue) ×1 IMPLANT
SOAP 2 % CHG 4 OZ (WOUND CARE) ×2 IMPLANT
SPECIMEN JAR SMALL (MISCELLANEOUS) ×1 IMPLANT
SPLINT FIBERGLASS 4X30 (CAST SUPPLIES) ×1 IMPLANT
SPONGE GAUZE 2X2 STER 10/PKG (GAUZE/BANDAGES/DRESSINGS)
SPONGE GAUZE 4X4 12PLY (GAUZE/BANDAGES/DRESSINGS) ×1 IMPLANT
SPONGE LAP 18X18 X RAY DECT (DISPOSABLE) ×1 IMPLANT
SUCTION FRAZIER TIP 10 FR DISP (SUCTIONS) ×1 IMPLANT
SUT ETHILON 8 0 BV130 4 (SUTURE) ×1 IMPLANT
SUT MERSILENE 4 0 P 3 (SUTURE) IMPLANT
SUT PROLENE 3 0 PS 2 (SUTURE) ×2 IMPLANT
SUT PROLENE 4 0 PS 2 18 (SUTURE) IMPLANT
SUT VIC AB 2-0 CT1 27 (SUTURE) ×2
SUT VIC AB 2-0 CT1 TAPERPNT 27 (SUTURE) IMPLANT
SYR CONTROL 10ML LL (SYRINGE) ×1 IMPLANT
TOWEL OR 17X24 6PK STRL BLUE (TOWEL DISPOSABLE) ×2 IMPLANT
TOWEL OR 17X26 10 PK STRL BLUE (TOWEL DISPOSABLE) ×2 IMPLANT
TUBE CONNECTING 12X1/4 (SUCTIONS) ×1 IMPLANT
UNDERPAD 30X30 INCONTINENT (UNDERPADS AND DIAPERS) ×2 IMPLANT
WATER STERILE IRR 1000ML POUR (IV SOLUTION) ×1 IMPLANT
YANKAUER SUCT BULB TIP NO VENT (SUCTIONS) ×1 IMPLANT

## 2012-04-04 NOTE — Transfer of Care (Signed)
Immediate Anesthesia Transfer of Care Note  Patient: Patricia Villanueva  Procedure(s) Performed: Procedure(s) (LRB): NERVE EXPLORATION (Left)  Patient Location: PACU  Anesthesia Type: General  Level of Consciousness: sedated  Airway & Oxygen Therapy: Patient Spontanous Breathing and Patient connected to nasal cannula oxygen  Post-op Assessment: Report given to PACU RN and Post -op Vital signs reviewed and stable  Post vital signs: Reviewed and stable  Complications: No apparent anesthesia complications

## 2012-04-04 NOTE — Anesthesia Procedure Notes (Signed)
Procedure Name: LMA Insertion Date/Time: 04/04/2012 9:31 AM Performed by: Gwenyth Allegra Pre-anesthesia Checklist: Patient identified, Timeout performed, Emergency Drugs available, Suction available and Patient being monitored Patient Re-evaluated:Patient Re-evaluated prior to inductionOxygen Delivery Method: Circle system utilized Preoxygenation: Pre-oxygenation with 100% oxygen Intubation Type: IV induction LMA: LMA inserted LMA Size: 4.0 Number of attempts: 1 Placement Confirmation: positive ETCO2 and breath sounds checked- equal and bilateral Dental Injury: Teeth and Oropharynx as per pre-operative assessment
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
## 2012-04-04 NOTE — Progress Notes (Signed)
LABS FROM 03/18/12 [facility] FOR SURGERY, PER DR CREWS.

## 2012-04-04 NOTE — Progress Notes (Signed)
DR Melvyn Novas STATED LABS FROM 03-18-12 [facility].
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
## 2012-04-04 NOTE — Anesthesia Postprocedure Evaluation (Signed)
  Anesthesia Post-op Note  Patient: Patricia Villanueva  Procedure(s) Performed: Procedure(s) (LRB): NERVE EXPLORATION (Left)  Patient Location: PACU  Anesthesia Type: General  Level of Consciousness: awake, alert  and oriented  Airway and Oxygen Therapy: Patient Spontanous Breathing  Post-op Pain: mild  Post-op Assessment: Post-op Vital signs reviewed, Patient's Cardiovascular Status Stable and Respiratory Function Stable  Post-op Vital Signs: stable  Complications: No apparent anesthesia complications

## 2012-04-04 NOTE — Preoperative (Signed)
Beta Blockers   Reason not to administer Beta Blockers:Not Applicable 

## 2012-04-06 ENCOUNTER — Encounter (HOSPITAL_COMMUNITY): Payer: Self-pay | Admitting: Orthopedic Surgery
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
## 2012-05-10 ENCOUNTER — Emergency Department (INDEPENDENT_AMBULATORY_CARE_PROVIDER_SITE_OTHER)
Admission: EM | Admit: 2012-05-10 | Discharge: 2012-05-10 | Disposition: A | Discharge status: Home / Self Care | Payer: Medicaid Other | Source: Home / Self Care

## 2012-05-10 ENCOUNTER — Encounter (HOSPITAL_COMMUNITY): Payer: Self-pay | Admitting: Emergency Medicine

## 2012-05-10 DIAGNOSIS — N898 Other specified noninflammatory disorders of vagina: Secondary | ICD-10-CM

## 2012-05-10 DIAGNOSIS — R300 Dysuria: Secondary | ICD-10-CM

## 2012-05-10 DIAGNOSIS — A599 Trichomoniasis, unspecified: Secondary | ICD-10-CM

## 2012-05-10 DIAGNOSIS — N390 Urinary tract infection, site not specified: Secondary | ICD-10-CM

## 2012-05-10 LAB — POCT URINALYSIS DIP (DEVICE)
Nitrite: NEGATIVE
Protein, ur: NEGATIVE mg/dL
pH: 5.5 (ref 5.0–8.0)

## 2012-05-10 MED ORDER — IBUPROFEN 600 MG PO TABS: 600.0000 mg | ORAL_TABLET | Freq: Three times a day (TID) | ORAL | Status: DC | PRN

## 2012-05-10 MED ORDER — CYCLOBENZAPRINE HCL 10 MG PO TABS: 10.0000 mg | ORAL_TABLET | Freq: Two times a day (BID) | ORAL | Status: AC | PRN

## 2012-05-10 MED ORDER — METRONIDAZOLE 500 MG PO TABS: 500.0000 mg | ORAL_TABLET | Freq: Two times a day (BID) | ORAL | Status: AC

## 2012-05-10 MED ORDER — SULFAMETHOXAZOLE-TRIMETHOPRIM 800-160 MG PO TABS: 1.0000 | ORAL_TABLET | Freq: Two times a day (BID) | ORAL | Status: AC
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
## 2012-05-10 NOTE — ED Notes (Signed)
Instructions incomplete-reprinted

## 2012-05-10 NOTE — ED Provider Notes (Signed)
History     CSN: [id number]  Arrival date & time 05/10/12  1316   None     Chief Complaint  Patient presents with  . Back Pain    (Consider location/radiation/quality/duration/timing/severity/associated sxs/prior treatment) The history is provided by the patient.  Patient with multiple complaints. Patricia Villanueva is a 21 y.o. female who complains of mid back pain described as intermittent sharp in nature that began 5 days ago. The pain is aggravated with sitting with no radiation, numbness or tingling into extremities.  No known injury noted.  Currently unemployed.  Denies history of back problems, has not taken medications for pain relief.    Additionally complains of white malodorous irritating vaginal discharge for 4 days associated with dysuria.  Denies abnormal vaginal bleeding, significant pelvic pain or fever. sexually active, does not use condoms, no change in partner.  Last unprotected intercourse 2 weeks ago.  Denies history of known exposure to STD or symptoms in partner.  Patient's last menstrual period was 04/23/2012.  No history of STD's.      History reviewed. No pertinent past medical history.  Past Surgical History  Procedure Date  . Dental surgery   . Nerve exploration 04/04/2012    Procedure: NERVE EXPLORATION;  Surgeon: Sharma Covert, MD;  Location: [facility] OR;  Service: Orthopedics;  Laterality: Left;  Left Arm Laceration with Nerve Repair  . Arm surgery     left arm surgery to repair a nerve    No family history on file.  History  Substance Use Topics  . Smoking status: Never Smoker   . Smokeless tobacco: Not on file  . Alcohol Use: Yes     occasional    OB History    Grav Para Term Preterm Abortions TAB SAB Ect Mult Living                  Review of Systems  Constitutional: Negative.   HENT: Negative.   Respiratory: Negative.   Cardiovascular: Negative.   Gastrointestinal: Positive for abdominal pain. Negative for nausea, vomiting, diarrhea,  constipation and blood in stool.  Genitourinary: Positive for dysuria and vaginal discharge. Negative for urgency, frequency, flank pain, vaginal bleeding, difficulty urinating, genital sores, vaginal pain, menstrual problem, pelvic pain and dyspareunia.  Musculoskeletal: Positive for back pain. Negative for myalgias, joint swelling, arthralgias and gait problem.  Skin: Negative.   Neurological: Positive for light-headedness. Negative for dizziness, weakness, numbness and headaches.  Psychiatric/Behavioral: Negative.     Allergies  Review of patient's allergies indicates no known allergies.  Home Medications   Current Outpatient Rx  Name Route Sig Dispense Refill  . IBUPROFEN 600 MG PO TABS Oral Take 600 mg by mouth every 8 (eight) hours as needed. For cramps    . CEPHALEXIN 500 MG PO CAPS Oral Take 500 mg by mouth 4 (four) times daily.    Marland Kitchen METRONIDAZOLE 500 MG PO TABS Oral Take 1 tablet (500 mg total) by mouth 2 (two) times daily. 14 tablet 0  . NORGESTIM-ETH ESTRAD TRIPHASIC 0.18/0.215/0.25 MG-35 MCG PO TABS Oral Take 1 tablet by mouth daily.    . SULFAMETHOXAZOLE-TRIMETHOPRIM 800-160 MG PO TABS Oral Take 1 tablet by mouth every 12 (twelve) hours. 10 tablet 0    BP 116/73  Pulse 73  Temp 98.2 F (36.8 C) (Oral)  Resp 16  SpO2 96%  LMP 04/23/2012  Physical Exam  Nursing note and vitals reviewed. Constitutional: She is oriented to person, place, and time. Vital signs are  normal. She appears well-developed and well-nourished. She is active and cooperative.  HENT:  Head: Normocephalic.  Eyes: Conjunctivae are normal. Pupils are equal, round, and reactive to light. No scleral icterus.  Neck: Trachea normal, normal range of motion and full passive range of motion without pain. Neck supple. No muscular tenderness present.  Cardiovascular: Normal rate, regular rhythm, normal heart sounds, intact distal pulses and normal pulses.   Pulmonary/Chest: Effort normal and breath sounds  normal.  Abdominal: Soft. Normal appearance and bowel sounds are normal. There is no hepatosplenomegaly. There is tenderness in the periumbilical area and suprapubic area. There is no rebound and no CVA tenderness.  Genitourinary: Uterus normal. Pelvic exam was performed with patient supine. There is no rash, tenderness or lesion on the right labia. There is no rash, tenderness or lesion on the left labia. Cervix exhibits discharge. Cervix exhibits no motion tenderness and no friability. Right adnexum displays no mass, no tenderness and no fullness. Left adnexum displays no mass, no tenderness and no fullness. No erythema, tenderness or bleeding around the vagina. No foreign body around the vagina. No signs of injury around the vagina. Vaginal discharge found.       Thin white discharge in vagina  Musculoskeletal:       Right shoulder: Normal.       Left shoulder: Normal.       Cervical back: Normal.       Thoracic back: She exhibits tenderness and spasm. She exhibits normal range of motion, no bony tenderness, no swelling, no edema and no laceration.       Mid thoracic bilateral paravertebral spasm. Lumbosacral spine area reveals no local tenderness or mass.  No painful or reduced ROM noted. Straight leg raise is negative.  DTR's, motor strength and sensation normal, including heel and toe gait.  Peripheral pulses are palpable.  Lymphadenopathy:    She has no cervical adenopathy.       Right: No inguinal adenopathy present.       Left: No inguinal adenopathy present.  Neurological: She is alert and oriented to person, place, and time. She has normal strength. No cranial nerve deficit or sensory deficit. Coordination and gait normal. GCS eye subscore is 4. GCS verbal subscore is 5. GCS motor subscore is 6.  Skin: Skin is warm and dry.  Psychiatric: She has a normal mood and affect. Her speech is normal and behavior is normal. Judgment and thought content normal. Cognition and memory are normal.     ED Course  Procedures (including critical care time)  Labs Reviewed  POCT URINALYSIS DIP (DEVICE) - Abnormal; Notable for the following:    Hgb urine dipstick TRACE (*)     Leukocytes, UA SMALL (*)  Biochemical Testing Only. Please order routine urinalysis from main lab if confirmatory testing is needed.   All other components within normal limits  WET PREP, GENITAL - Abnormal; Notable for the following:    Yeast Wet Prep HPF POC NEGATIVE (*)     Trich, Wet Prep MODERATE (*)     Clue Cells Wet Prep HPF POC FEW (*)     WBC, Wet Prep HPF POC TOO NUMEROUS TO COUNT (*)     All other components within normal limits  POCT PREGNANCY, URINE  GC/CHLAMYDIA PROBE AMP, GENITAL   No results found.   1. Trichomoniasis   2. Vaginal Discharge   3. Dysuria   4. UTI (lower urinary tract infection)       MDM  NSAIDS  and Muscle relaxants as prescribed.  Rest, intermittent application of cold packs (later, may switch to heat, but do not sleep on heating pad), analgesics and muscle relaxants as recommended.  Proper lifting with avoidance of heavy lifting discussed. Call or return to clinic prn if these symptoms worsen or fail to improve as anticipated. Imaging not indicated at this time.   Await GC/CT cultures.  Begin Cipro and Flagyl as prescribed.  Condoms for STD prevention.  Because of the slight increase in pregnancy risk, the patient is asked to back up her OCP with condom or other method during this (or any) cycle during which she is taking antibiotics. Your partner will need to be examined/treated for STD's.  Abstain form sexual intercourse for 7 days.   Follow up with your GYN, [place] [facility] or [facility] for your continued gynocological needs.           Johnsie Kindred, NP 05/10/12 1524  Johnsie Kindred, NP 05/10/12 1525

## 2012-05-10 NOTE — ED Notes (Signed)
Patient reports low back pain 6 days ago.  Pain is shooting up back from lower back.  No known injury.  Vaginal discharge for 7 days, yellow/green.  Low abdominal pain.  .  Reports itching/burning sensation with urination.

## 2012-05-11 LAB — GC/CHLAMYDIA PROBE AMP, GENITAL
Chlamydia, DNA Probe: NEGATIVE
GC Probe Amp, Genital: NEGATIVE
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
## 2012-05-14 NOTE — ED Provider Notes (Signed)
Medical screening examination/treatment/procedure(s) were performed by non-physician practitioner and as supervising physician I was immediately available for consultation/collaboration.  Luiz Blare MD   Luiz Blare, MD 05/14/12 2116

## 2013-02-12 ENCOUNTER — Ambulatory Visit: Payer: Medicaid Other | Attending: Family Medicine | Admitting: Family Medicine

## 2013-02-12 VITALS — BP 113/80 | HR 68 | Temp 97.7°F | Resp 18 | Wt 208.0 lb

## 2013-02-12 DIAGNOSIS — N912 Amenorrhea, unspecified: Secondary | ICD-10-CM | POA: Insufficient documentation

## 2013-02-12 DIAGNOSIS — N911 Secondary amenorrhea: Secondary | ICD-10-CM

## 2013-02-12 NOTE — Progress Notes (Signed)
Patient ID: Patricia Villanueva, female   DOB: 02-Jul-1991, 22 y.o.   MRN: [id number] CC: referral to gynecologist  HPI: Pt reports that she would like to be referred to gynecology for Pap Pelvic and evaluation of 8 months of amenorrhea after completing oral contraceptives for about 4 years.  She is having no symptoms. She has no history of abnormal pap exams per patient.   No Known Allergies History reviewed. No pertinent past medical history. No current outpatient prescriptions on file prior to visit.   No current facility-administered medications on file prior to visit.   No family history on file. History   Social History  . Marital Status: Single    Spouse Name: N/A    Number of Children: N/A  . Years of Education: N/A   Occupational History  . Not on file.   Social History Main Topics  . Smoking status: Never Smoker   . Smokeless tobacco: Not on file  . Alcohol Use: Yes     Comment: occasional  . Drug Use: No  . Sexually Active: Yes    Birth Control/ Protection: Pill   Other Topics Concern  . Not on file   Social History Narrative  . No narrative on file    Review of Systems  Constitutional: Negative for fever, chills, diaphoresis, activity change, appetite change and fatigue.  HENT: Negative for ear pain, nosebleeds, congestion, facial swelling, rhinorrhea, neck pain, neck stiffness and ear discharge.   Eyes: Negative for pain, discharge, redness, itching and visual disturbance.  Respiratory: Negative for cough, choking, chest tightness, shortness of breath, wheezing and stridor.   Cardiovascular: Negative for chest pain, palpitations and leg swelling.  Gastrointestinal: Negative for abdominal distention.  Genitourinary: Negative for dysuria, urgency, frequency, hematuria, flank pain, decreased urine volume, difficulty urinating and dyspareunia.  Musculoskeletal: Negative for back pain, joint swelling, arthralgias and gait problem.  Neurological: Negative for dizziness,  tremors, seizures, syncope, facial asymmetry, speech difficulty, weakness, light-headedness, numbness and headaches.  Hematological: Negative for adenopathy. Does not bruise/bleed easily.  Psychiatric/Behavioral: Negative for hallucinations, behavioral problems, confusion, dysphoric mood, decreased concentration and agitation.    Objective:   Filed Vitals:   02/12/13 1347  BP: 113/80  Pulse: 68  Temp: 97.7 F (36.5 C)  Resp: 18    Physical Exam  Constitutional: Appears well-developed and well-nourished. No distress.  HENT: Normocephalic. External right and left ear normal. Oropharynx is clear and moist.  Eyes: Conjunctivae and EOM are normal. PERRLA, no scleral icterus.  Neck: Normal ROM. Neck supple. No JVD. No tracheal deviation. No thyromegaly.  CVS: RRR, S1/S2 +, no murmurs, no gallops, no carotid bruit.  Pulmonary: Effort and breath sounds normal, no stridor, rhonchi, wheezes, rales.  Abdominal: Soft. BS +,  no distension, tenderness, rebound or guarding.  Musculoskeletal: Normal range of motion. No edema and no tenderness.  Lymphadenopathy: No lymphadenopathy noted, cervical, inguinal. Neuro: Alert. Normal reflexes, muscle tone coordination. No cranial nerve deficit. Skin: Skin is warm and dry. No rash noted. Not diaphoretic. No erythema. No pallor.  Psychiatric: Normal mood and affect. Behavior, judgment, thought content normal.   Lab Results  Component Value Date   WBC 9.1 03/18/2012   HGB 13.5 03/18/2012   HCT 40.7 03/18/2012   MCV 81.9 03/18/2012   PLT 246 03/18/2012   Lab Results  Component Value Date   CREATININE 0.81 03/18/2012   BUN 11 03/18/2012   NA 139 03/18/2012   K 4.3 03/18/2012   CL 106 03/18/2012  CO2 23 03/18/2012    No results found for this basename: HGBA1C        Assessment:   Secondary amenorrhea       Plan:   Referral to gynecology   cpe in 1 month  Rodney Langton, MD, CDE, FAAFP [facility] [employer] [facility] [place], [place]

## 2013-02-12 NOTE — Progress Notes (Signed)
Patient here to establish a primary care Dr Needs referral for yearly pap smear

## 2013-02-12 NOTE — Patient Instructions (Addendum)
Secondary Amenorrhea   Secondary amenorrhea is the stopping of menstrual flow for 3 to 6 months in a female who has previously had periods. There are many possible causes. Most of these causes are not serious. Usually treating the underlying problem causing the loss of menses will return your periods to normal.  CAUSES   Some common and uncommon causes of not menstruating include:  · Malnutrition.  · Low blood sugar (hypoglycemia).  · Polycystic ovarian disease.  · Stress or fear.  · Breastfeeding.  · Hormone imbalance.  · Ovarian failure.  · Medications.  · Extreme obesity.  · Cystic fibrosis.  · Low body weight or drastic weight reduction from any cause.  · Early menopause.  · Removal of ovaries or uterus.  · Contraceptives.  · Illness.  · Long term (chronic) illnesses.  · Cushing's syndrome.  · Thyroid problems.  · Birth control pills, patches, or vaginal rings for birth control.  DIAGNOSIS   This diagnosis is made by your caregiver taking a medical history and doing a physical exam. Pregnancy must be ruled out. Often times, numerous blood tests of different hormones in the body may be measured. Urine testing may be done. Specialized x-rays may have to be done as well as measuring the body mass index (BMI).  TREATMENT   Treatment depends on the cause of the amenorrhea. If an eating disorder is present, this can be treated with an adequate diet and therapy. Chronic illnesses may improve with treatment of the illness. Overall, the outlook is good. The amenorrhea may be corrected with medications, lifestyle changes, or surgery. If the amenorrhea cannot be corrected, it is sometimes possible to create a false menstruation with medications.  Document Released: 10/28/2006 Document Revised: 12/09/2011 Document Reviewed: 09/04/2007  Exit[employer]® Patient Information ©2013 [employer].

## 2013-02-13 ENCOUNTER — Encounter: Payer: Self-pay | Admitting: Family Medicine

## 2013-02-13 DIAGNOSIS — N911 Secondary amenorrhea: Secondary | ICD-10-CM | POA: Insufficient documentation

## 2013-02-13 LAB — PREGNANCY, URINE: Preg Test, Ur: NEGATIVE

## 2013-02-13 NOTE — Progress Notes (Signed)
Quick Note:  Please inform patient Pregnancy test came back negative.   Rodney Langton, MD, CDE, FAAFP [facility] [place], [place]   ______

## 2013-02-15 ENCOUNTER — Telehealth: Payer: Self-pay

## 2013-02-15 NOTE — Telephone Encounter (Signed)
Patient is aware her pregnancy test came back negative

## 2013-02-25 ENCOUNTER — Telehealth: Payer: Self-pay | Admitting: *Deleted

## 2013-02-25 NOTE — Telephone Encounter (Signed)
02/25/13 Patient made aware that pregnancy test was negative per Dr. Laural Benes P.[facility]

## 2013-03-10 ENCOUNTER — Encounter (HOSPITAL_COMMUNITY): Payer: Self-pay

## 2013-03-10 ENCOUNTER — Emergency Department (HOSPITAL_COMMUNITY)
Admission: EM | Admit: 2013-03-10 | Discharge: 2013-03-10 | Disposition: A | Discharge status: Home / Self Care | Payer: Medicaid Other | Attending: Emergency Medicine | Admitting: Emergency Medicine

## 2013-03-10 DIAGNOSIS — S51809A Unspecified open wound of unspecified forearm, initial encounter: Secondary | ICD-10-CM | POA: Insufficient documentation

## 2013-03-10 DIAGNOSIS — S51802A Unspecified open wound of left forearm, initial encounter: Secondary | ICD-10-CM

## 2013-03-10 DIAGNOSIS — S51812A Laceration without foreign body of left forearm, initial encounter: Secondary | ICD-10-CM

## 2013-03-10 DIAGNOSIS — T7411XA Adult physical abuse, confirmed, initial encounter: Secondary | ICD-10-CM | POA: Insufficient documentation
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
## 2013-03-10 NOTE — ED Notes (Signed)
The patient is AOx4 and comfortable with the discharge instructions. 

## 2013-03-10 NOTE — ED Notes (Signed)
MD at bedside. 
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
## 2013-03-11 ENCOUNTER — Encounter: Payer: Medicaid Other | Admitting: Obstetrics & Gynecology

## 2013-04-01 ENCOUNTER — Inpatient Hospital Stay (HOSPITAL_COMMUNITY)
Admission: AD | Admit: 2013-04-01 | Discharge: 2013-04-01 | Disposition: A | Discharge status: Home / Self Care | Payer: Medicaid Other | Source: Ambulatory Visit | Attending: Obstetrics & Gynecology | Admitting: Obstetrics & Gynecology

## 2013-04-01 ENCOUNTER — Encounter (HOSPITAL_COMMUNITY): Payer: Self-pay | Admitting: *Deleted

## 2013-04-01 ENCOUNTER — Inpatient Hospital Stay (HOSPITAL_COMMUNITY): Payer: Medicaid Other

## 2013-04-01 DIAGNOSIS — B9689 Other specified bacterial agents as the cause of diseases classified elsewhere: Secondary | ICD-10-CM | POA: Insufficient documentation

## 2013-04-01 DIAGNOSIS — O2390 Unspecified genitourinary tract infection in pregnancy, unspecified trimester: Secondary | ICD-10-CM | POA: Insufficient documentation

## 2013-04-01 DIAGNOSIS — O26859 Spotting complicating pregnancy, unspecified trimester: Secondary | ICD-10-CM | POA: Insufficient documentation

## 2013-04-01 DIAGNOSIS — N760 Acute vaginitis: Secondary | ICD-10-CM | POA: Insufficient documentation

## 2013-04-01 DIAGNOSIS — M545 Low back pain, unspecified: Secondary | ICD-10-CM | POA: Insufficient documentation

## 2013-04-01 DIAGNOSIS — R109 Unspecified abdominal pain: Secondary | ICD-10-CM | POA: Insufficient documentation

## 2013-04-01 DIAGNOSIS — A499 Bacterial infection, unspecified: Secondary | ICD-10-CM | POA: Insufficient documentation

## 2013-04-01 DIAGNOSIS — Z3490 Encounter for supervision of normal pregnancy, unspecified, unspecified trimester: Secondary | ICD-10-CM

## 2013-04-01 LAB — URINALYSIS, ROUTINE W REFLEX MICROSCOPIC
Bilirubin Urine: NEGATIVE
Glucose, UA: NEGATIVE mg/dL
Ketones, ur: NEGATIVE mg/dL
Protein, ur: NEGATIVE mg/dL
Urobilinogen, UA: 0.2 mg/dL (ref 0.0–1.0)

## 2013-04-01 LAB — WET PREP, GENITAL: Trich, Wet Prep: NONE SEEN

## 2013-04-01 LAB — URINE MICROSCOPIC-ADD ON

## 2013-04-01 LAB — CBC
Hemoglobin: 12.4 g/dL (ref 12.0–15.0)
MCH: 27.3 pg (ref 26.0–34.0)
Platelets: 210 10*3/uL (ref 150–400)
RBC: 4.55 MIL/uL (ref 3.87–5.11)
WBC: 12.1 10*3/uL — ABNORMAL HIGH (ref 4.0–10.5)

## 2013-04-01 LAB — HCG, QUANTITATIVE, PREGNANCY: hCG, Beta Chain, Quant, S: 60430 m[IU]/mL — ABNORMAL HIGH (ref <5)

## 2013-04-01 MED ORDER — METRONIDAZOLE 500 MG PO TABS: 500.0000 mg | ORAL_TABLET | Freq: Three times a day (TID) | ORAL | Status: DC

## 2013-04-01 MED ORDER — PRENATAL VITAMINS 28-0.8 MG PO TABS: 1.0000 | ORAL_TABLET | Freq: Every day | ORAL | Status: DC

## 2013-04-01 NOTE — MAU Provider Note (Signed)
History     CSN: [id number]  Arrival date and time: 04/01/13 1744   First Provider Initiated Contact with Patient 04/01/13 2022      Chief Complaint  Patient presents with  . Abdominal Pain   HPI This is a 22 y.o. female at [redacted]w[redacted]d who presents with c/o intermittent cramping and low back pain. Has not had menses for 8-12 months. No bleeding. Stopped OCPs 8 mos ago.   RN note: Abdominal pain since Monday 03/29/13 intermittent And yesterday 03/31/13 pain was all day. Pt has not had a period for over a year. Pt also has lower back pain.     OB History   Grav Para Term Preterm Abortions TAB SAB Ect Mult Living   1               No past medical history on file.  Past Surgical History  Procedure Laterality Date  . Dental surgery    . Nerve exploration  04/04/2012    Procedure: NERVE EXPLORATION;  Surgeon: Sharma Covert, MD;  Location: [facility] OR;  Service: Orthopedics;  Laterality: Left;  Left Arm Laceration with Nerve Repair  . Arm surgery      left arm surgery to repair a nerve    No family history on file.  History  Substance Use Topics  . Smoking status: Never Smoker   . Smokeless tobacco: Not on file  . Alcohol Use: Yes     Comment: occasional    Allergies: No Known Allergies  No prescriptions prior to admission    Review of Systems  Constitutional: Negative for fever, chills and malaise/fatigue.  Gastrointestinal: Positive for abdominal pain. Negative for nausea, vomiting, diarrhea and constipation.  Genitourinary: Negative for dysuria.  Musculoskeletal: Positive for back pain.  Neurological: Negative for dizziness and headaches.   Physical Exam   Blood pressure 122/77, pulse 75, temperature 98.3 F (36.8 C), temperature source Oral, resp. rate 16, height 5\' 6"  (1.676 m), weight 97.251 kg (214 lb 6.4 oz), last menstrual period 03/10/2012, SpO2 100.00%.  Physical Exam  Constitutional: She is oriented to person, place, and time. She appears well-developed and  well-nourished. No distress.  HENT:  Head: Normocephalic.  Cardiovascular: Normal rate.   Respiratory: Effort normal.  GI: Soft. She exhibits no distension and no mass. There is tenderness (mild over lower abdomen). There is no rebound and no guarding.  Genitourinary: Uterus normal. Vaginal discharge (copious thin, milky) found.  No blood seen, Uterus difficult to palpate due to habitus Adnexae nontender  Musculoskeletal: Normal range of motion.  Neurological: She is alert and oriented to person, place, and time.  Skin: Skin is warm and dry.  Psychiatric: She has a normal mood and affect.    MAU Course  Procedures  MDM Results for orders placed during the hospital encounter of 04/01/13 (from the past 24 hour(s))  URINALYSIS, ROUTINE W REFLEX MICROSCOPIC     Status: Abnormal   Collection Time    04/01/13  6:40 PM      Result Value Range   Color, Urine YELLOW  YELLOW   APPearance CLEAR  CLEAR   Specific Gravity, Urine 1.025  1.005 - 1.030   pH 6.0  5.0 - 8.0   Glucose, UA NEGATIVE  NEGATIVE mg/dL   Hgb urine dipstick TRACE (*) NEGATIVE   Bilirubin Urine NEGATIVE  NEGATIVE   Ketones, ur NEGATIVE  NEGATIVE mg/dL   Protein, ur NEGATIVE  NEGATIVE mg/dL   Urobilinogen, UA 0.2  0.0 - 1.0  mg/dL   Nitrite NEGATIVE  NEGATIVE   Leukocytes, UA TRACE (*) NEGATIVE  URINE MICROSCOPIC-ADD ON     Status: Abnormal   Collection Time    04/01/13  6:40 PM      Result Value Range   Squamous Epithelial / LPF FEW (*) RARE   WBC, UA 0-2  <3 WBC/hpf   RBC / HPF 0-2  <3 RBC/hpf   Bacteria, UA RARE  RARE  POCT PREGNANCY, URINE     Status: Abnormal   Collection Time    04/01/13  6:51 PM      Result Value Range   Preg Test, Ur POSITIVE (*) NEGATIVE  HCG, QUANTITATIVE, PREGNANCY     Status: Abnormal   Collection Time    04/01/13  8:25 PM      Result Value Range   hCG, Beta Chain, Sharene Butters, [place] [address] (*) <5 mIU/mL  CBC     Status: Abnormal   Collection Time    04/01/13  8:25 PM      Result Value  Range   WBC 12.1 (*) 4.0 - 10.5 K/uL   RBC 4.55  3.87 - 5.11 MIL/uL   Hemoglobin 12.4  12.0 - 15.0 g/dL   HCT 60.4  54.0 - 98.1 %   MCV 82.0  78.0 - 100.0 fL   MCH 27.3  26.0 - 34.0 pg   MCHC 33.2  30.0 - 36.0 g/dL   RDW 19.1  47.8 - 29.5 %   Platelets 210  150 - 400 K/uL  WET PREP, GENITAL     Status: Abnormal   Collection Time    04/01/13  9:05 PM      Result Value Range   Yeast Wet Prep HPF POC FEW (*) NONE SEEN   Trich, Wet Prep NONE SEEN  NONE SEEN   Clue Cells Wet Prep HPF POC NONE SEEN  NONE SEEN   WBC, Wet Prep HPF POC FEW (*) NONE SEEN  US Ob Transvaginal  04/01/2013   *RADIOLOGY REPORT*  Clinical Data: Pelvic pain.  Quantitative beta HCG pending.  OBSTETRIC <14 WK [place] AND TRANSVAGINAL OB US  Technique:  Both transabdominal and transvaginal ultrasound examinations were performed for complete evaluation of the gestation as well as the maternal uterus, adnexal regions, and pelvic cul-de-sac.  Transvaginal technique was performed to assess early pregnancy.  Comparison:  None.  Intrauterine gestational sac:  Visualized/normal in shape. Yolk sac: Present. Embryo: Present. Cardiac Activity: Present. Heart Rate: 139 bpm  CRL: 11.1  mm  7 w  2 d             [place] EDC: 11/16/2013  Maternal uterus/adnexae: No subchorionic hemorrhage.  Physiologic appearance of the ovaries. No free fluid.  IMPRESSION: Uncomplicated single intrauterine pregnancy.   Original Report Authenticated By: Andreas Newport, M.D.    Assessment and Plan  A:  SIUP at 7.2 weeks      BV by clinical exam      First trimester spotting  P:  Discharge home       Rx Flagyl       List of providers given, pt undecided about where she wants to go   [facility] 04/01/2013, 9:05 PM

## 2013-04-01 NOTE — MAU Note (Signed)
Patient states she has had abdominal and low back pain since 6-30. Has not had a period since she stopped taking BCP's 8 months ago. Has slight vaginal irritation and slight spotting on and off.

## 2013-04-01 NOTE — MAU Note (Signed)
Abdominal pain since Monday 03/29/13 intermittent  And yesterday 03/31/13 pain was all day.  Pt has not had a period for over a year.  Pt also has lower back pain.

## 2013-04-02 LAB — GC/CHLAMYDIA PROBE AMP
CT Probe RNA: NEGATIVE
GC Probe RNA: NEGATIVE

## 2013-04-28 LAB — OB RESULTS CONSOLE GC/CHLAMYDIA
CHLAMYDIA, DNA PROBE: NEGATIVE
Gonorrhea: NEGATIVE

## 2013-04-28 LAB — OB RESULTS CONSOLE RUBELLA ANTIBODY, IGM: Rubella: NON-IMMUNE/NOT IMMUNE

## 2013-04-28 LAB — OB RESULTS CONSOLE ABO/RH: RH TYPE: POSITIVE

## 2013-04-28 LAB — OB RESULTS CONSOLE GBS: STREP GROUP B AG: POSITIVE

## 2013-04-28 LAB — OB RESULTS CONSOLE ANTIBODY SCREEN: ANTIBODY SCREEN: NEGATIVE

## 2013-04-28 LAB — OB RESULTS CONSOLE HIV ANTIBODY (ROUTINE TESTING): HIV: NONREACTIVE

## 2013-04-28 LAB — OB RESULTS CONSOLE HEPATITIS B SURFACE ANTIGEN: Hepatitis B Surface Ag: NEGATIVE

## 2013-04-28 LAB — OB RESULTS CONSOLE RPR: RPR: NONREACTIVE

## 2013-05-20 ENCOUNTER — Emergency Department (HOSPITAL_COMMUNITY)
Admission: EM | Admit: 2013-05-20 | Discharge: 2013-05-20 | Disposition: A | Discharge status: Home / Self Care | Payer: Medicaid Other | Attending: Emergency Medicine | Admitting: Emergency Medicine

## 2013-05-20 DIAGNOSIS — M25561 Pain in right knee: Secondary | ICD-10-CM

## 2013-05-20 DIAGNOSIS — Y92410 Unspecified street and highway as the place of occurrence of the external cause: Secondary | ICD-10-CM | POA: Insufficient documentation

## 2013-05-20 DIAGNOSIS — O9989 Other specified diseases and conditions complicating pregnancy, childbirth and the puerperium: Secondary | ICD-10-CM | POA: Insufficient documentation

## 2013-05-20 DIAGNOSIS — IMO0002 Reserved for concepts with insufficient information to code with codable children: Secondary | ICD-10-CM | POA: Insufficient documentation

## 2013-05-20 DIAGNOSIS — S8990XA Unspecified injury of unspecified lower leg, initial encounter: Secondary | ICD-10-CM | POA: Insufficient documentation

## 2013-05-20 DIAGNOSIS — M549 Dorsalgia, unspecified: Secondary | ICD-10-CM

## 2013-05-20 DIAGNOSIS — Y9389 Activity, other specified: Secondary | ICD-10-CM | POA: Insufficient documentation

## 2013-05-20 MED ORDER — ACETAMINOPHEN 325 MG PO TABS
650.0000 mg | ORAL_TABLET | Freq: Once | ORAL | Status: AC
  Administered 2013-05-20: 650 mg via ORAL
  Filled 2013-05-20: qty 2
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
## 2013-05-22 NOTE — ED Provider Notes (Signed)
Medical screening examination/treatment/procedure(s) were performed by non-physician practitioner and as supervising physician I was immediately available for consultation/collaboration.  Arushi Partridge, MD 05/22/13 1603 

## 2013-07-27 ENCOUNTER — Other Ambulatory Visit (HOSPITAL_COMMUNITY)
Admission: RE | Admit: 2013-07-27 | Discharge: 2013-07-27 | Disposition: A | Discharge status: Home / Self Care | Payer: Medicaid Other | Source: Ambulatory Visit | Attending: Family Medicine | Admitting: Family Medicine

## 2013-07-27 ENCOUNTER — Encounter (HOSPITAL_COMMUNITY): Payer: Self-pay | Admitting: Emergency Medicine

## 2013-07-27 ENCOUNTER — Emergency Department (INDEPENDENT_AMBULATORY_CARE_PROVIDER_SITE_OTHER)
Admission: EM | Admit: 2013-07-27 | Discharge: 2013-07-27 | Disposition: A | Discharge status: Home / Self Care | Payer: Medicaid Other | Source: Home / Self Care | Attending: Family Medicine | Admitting: Family Medicine

## 2013-07-27 DIAGNOSIS — Z331 Pregnant state, incidental: Secondary | ICD-10-CM

## 2013-07-27 DIAGNOSIS — Z3402 Encounter for supervision of normal first pregnancy, second trimester: Secondary | ICD-10-CM

## 2013-07-27 DIAGNOSIS — Z113 Encounter for screening for infections with a predominantly sexual mode of transmission: Secondary | ICD-10-CM | POA: Insufficient documentation

## 2013-07-27 DIAGNOSIS — N898 Other specified noninflammatory disorders of vagina: Secondary | ICD-10-CM

## 2013-07-27 DIAGNOSIS — N760 Acute vaginitis: Secondary | ICD-10-CM | POA: Insufficient documentation

## 2013-07-27 MED ORDER — MICONAZOLE NITRATE 200 MG VA SUPP: 200.0000 mg | Freq: Every day | VAGINAL | Status: DC

## 2013-07-27 MED ORDER — METRONIDAZOLE 500 MG PO TABS: 500.0000 mg | ORAL_TABLET | Freq: Two times a day (BID) | ORAL | Status: DC
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
## 2013-07-27 NOTE — ED Notes (Signed)
Equipment at bedside. 

## 2013-07-27 NOTE — ED Provider Notes (Signed)
Patricia Villanueva is a 22 y.o. female who presents to Urgent Care today for rash near vagina with vaginal discharge present for a few days. Patient is [redacted] weeks gestation but unable to see her OB/GYN in a timely manner. She has not tried any medications yet. She feels well otherwise.   History reviewed. No pertinent past medical history. History  Substance Use Topics  . Smoking status: Never Smoker   . Smokeless tobacco: Not on file  . Alcohol Use: Yes     Comment: occasional   ROS as above Medications reviewed. No current facility-administered medications for this encounter.   Current Outpatient Prescriptions  Medication Sig Dispense Refill  . metroNIDAZOLE (FLAGYL) 500 MG tablet Take 1 tablet (500 mg total) by mouth 2 (two) times daily.  14 tablet  0  . miconazole (MICONAZOLE 3) 200 MG vaginal suppository Place 1 suppository (200 mg total) vaginally at bedtime.  3 suppository  0  . Prenatal Vit-Fe Fumarate-FA (PRENATAL VITAMINS) 28-0.8 MG TABS Take 1 tablet by mouth daily.  30 tablet  11    Exam:  BP 117/65  Pulse 68  Temp(Src) 98.6 F (37 C) (Oral)  Resp 20  SpO2 100%  LMP 03/10/2013 Gen: Well NAD HEENT: EOMI,  MMM Lungs: CTABL Nl WOB Heart: RRR no MRG Abd: NABS, NT, ND Exts: Non edematous BL  LE, warm and well perfused.  GYN: External genitalia with white discharge. Vaginal canal with thick white to greenish discharge. Normal-appearing cervix.  No results found for this or any previous visit (from the past 24 hour(s)). No results found.  Assessment and Plan: 22 y.o. female with vaginal discharge in pregnancy. Likely vaginitis. Cytology pending for gonorrhea Chlamydia Trichomonas BV and yeast. Plan treat empirically with a tried is all and miconazole.  Followup with OB/GYN soon.  Discussed warning signs or symptoms. Please see discharge instructions. Patient expresses understanding.      Rodolph Bong, MD 07/27/13 2149

## 2013-07-30 NOTE — ED Notes (Signed)
Lab review

## 2013-07-30 NOTE — ED Notes (Signed)
Labs positive for yeast and gardnerella ; OTC monistat approved for pregnant pt, Rx previously adequate to treat gardnerella

## 2013-08-03 ENCOUNTER — Telehealth (HOSPITAL_COMMUNITY): Payer: Self-pay | Admitting: *Deleted

## 2013-08-03 NOTE — ED Notes (Signed)
I called pt. Pt. verified x 2 and given results.  Pt. said she got the Rx. of Metronidazole, but has not gotten the Miconazole supp. or Monistat yet.  She said she was told she can get it OTC.  She said she would go get it. Vassie Moselle 08/03/2013
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
## 2013-09-30 NOTE — L&D Delivery Note (Signed)
Delivery Note Pt progressed to complete dilation and pushed great.  At 1:46 PM a viable female was delivered via Vaginal, Spontaneous Delivery (Presentation: Left Occiput Anterior).  APGAR: 8, 9; weight pending.   Placenta status: Intact, Spontaneous.  Cord: 3 vessels with the following complications: None.    Anesthesia: Epidural  Episiotomy: None Lacerations: 1st degree Suture Repair: 3.0 vicryl rapide Est. Blood Loss (mL): 350cc  Mom to postpartum.  Baby to Couplet care / Skin to Skin.  Oliver PilaICHARDSON,Paddy Walthall W 11/04/2013, 2:22 PM

## 2013-10-29 ENCOUNTER — Telehealth (HOSPITAL_COMMUNITY): Payer: Self-pay | Admitting: *Deleted

## 2013-10-29 ENCOUNTER — Encounter (HOSPITAL_COMMUNITY): Payer: Self-pay | Admitting: *Deleted

## 2013-10-29 NOTE — Telephone Encounter (Signed)
Preadmission screen  

## 2013-11-04 ENCOUNTER — Inpatient Hospital Stay (HOSPITAL_COMMUNITY)
Admission: AD | Admit: 2013-11-04 | Discharge: 2013-11-06 | DRG: 775 | Disposition: A | Discharge status: Home / Self Care | Payer: Medicaid Other | Source: Ambulatory Visit | Attending: Obstetrics and Gynecology | Admitting: Obstetrics and Gynecology

## 2013-11-04 ENCOUNTER — Encounter (HOSPITAL_COMMUNITY): Payer: Medicaid Other | Admitting: Anesthesiology

## 2013-11-04 ENCOUNTER — Encounter (HOSPITAL_COMMUNITY): Payer: Self-pay | Admitting: *Deleted

## 2013-11-04 ENCOUNTER — Inpatient Hospital Stay (HOSPITAL_COMMUNITY): Payer: Medicaid Other | Admitting: Anesthesiology

## 2013-11-04 DIAGNOSIS — IMO0001 Reserved for inherently not codable concepts without codable children: Secondary | ICD-10-CM

## 2013-11-04 DIAGNOSIS — Z22330 Carrier of Group B streptococcus: Secondary | ICD-10-CM

## 2013-11-04 DIAGNOSIS — O99892 Other specified diseases and conditions complicating childbirth: Secondary | ICD-10-CM | POA: Diagnosis present

## 2013-11-04 DIAGNOSIS — N911 Secondary amenorrhea: Secondary | ICD-10-CM

## 2013-11-04 DIAGNOSIS — O9989 Other specified diseases and conditions complicating pregnancy, childbirth and the puerperium: Secondary | ICD-10-CM

## 2013-11-04 HISTORY — DX: Other specified health status: Z78.9

## 2013-11-04 LAB — CBC
HCT: 34.4 % — ABNORMAL LOW (ref 36.0–46.0)
Hemoglobin: 11.7 g/dL — ABNORMAL LOW (ref 12.0–15.0)
MCH: 27.5 pg (ref 26.0–34.0)
MCHC: 34 g/dL (ref 30.0–36.0)
MCV: 80.8 fL (ref 78.0–100.0)
Platelets: 140 10*3/uL — ABNORMAL LOW (ref 150–400)
RBC: 4.26 MIL/uL (ref 3.87–5.11)
RDW: 12.8 % (ref 11.5–15.5)
WBC: 7.1 10*3/uL (ref 4.0–10.5)

## 2013-11-04 LAB — TYPE AND SCREEN
ABO/RH(D): A POS
Antibody Screen: NEGATIVE

## 2013-11-04 LAB — MRSA PCR SCREENING: MRSA BY PCR: NEGATIVE

## 2013-11-04 LAB — RPR: RPR Ser Ql: NONREACTIVE

## 2013-11-04 LAB — ABO/RH: ABO/RH(D): A POS

## 2013-11-04 LAB — POCT FERN TEST: POCT FERN TEST: POSITIVE

## 2013-11-04 MED ORDER — EPHEDRINE 5 MG/ML INJ
INTRAVENOUS | Status: AC
  Filled 2013-11-04: qty 4

## 2013-11-04 MED ORDER — DIPHENHYDRAMINE HCL 50 MG/ML IJ SOLN: 12.5000 mg | INTRAMUSCULAR | Status: DC | PRN

## 2013-11-04 MED ORDER — OXYTOCIN BOLUS FROM INFUSION
500.0000 mL | INTRAVENOUS | Status: DC
  Administered 2013-11-04: 500 mL via INTRAVENOUS

## 2013-11-04 MED ORDER — ONDANSETRON HCL 4 MG/2ML IJ SOLN: 4.0000 mg | Freq: Four times a day (QID) | INTRAMUSCULAR | Status: DC | PRN

## 2013-11-04 MED ORDER — OXYCODONE-ACETAMINOPHEN 5-325 MG PO TABS: 1.0000 | ORAL_TABLET | ORAL | Status: DC | PRN

## 2013-11-04 MED ORDER — PENICILLIN G POTASSIUM 5000000 UNITS IJ SOLR
2.5000 10*6.[IU] | INTRAMUSCULAR | Status: DC
  Filled 2013-11-04 (×5): qty 2.5

## 2013-11-04 MED ORDER — DEXTROSE 5 % IV SOLN
5.0000 10*6.[IU] | Freq: Once | INTRAVENOUS | Status: AC
  Administered 2013-11-04: 5 10*6.[IU] via INTRAVENOUS
  Filled 2013-11-04: qty 5

## 2013-11-04 MED ORDER — LACTATED RINGERS IV SOLN: 500.0000 mL | Freq: Once | INTRAVENOUS | Status: DC

## 2013-11-04 MED ORDER — DIBUCAINE 1 % RE OINT: 1.0000 "application " | TOPICAL_OINTMENT | RECTAL | Status: DC | PRN

## 2013-11-04 MED ORDER — CITRIC ACID-SODIUM CITRATE 334-500 MG/5ML PO SOLN: 30.0000 mL | ORAL | Status: DC | PRN

## 2013-11-04 MED ORDER — ONDANSETRON HCL 4 MG PO TABS: 4.0000 mg | ORAL_TABLET | ORAL | Status: DC | PRN

## 2013-11-04 MED ORDER — IBUPROFEN 600 MG PO TABS: 600.0000 mg | ORAL_TABLET | Freq: Four times a day (QID) | ORAL | Status: DC | PRN

## 2013-11-04 MED ORDER — ONDANSETRON HCL 4 MG/2ML IJ SOLN: 4.0000 mg | INTRAMUSCULAR | Status: DC | PRN

## 2013-11-04 MED ORDER — ACETAMINOPHEN 325 MG PO TABS: 650.0000 mg | ORAL_TABLET | ORAL | Status: DC | PRN

## 2013-11-04 MED ORDER — FLEET ENEMA 7-19 GM/118ML RE ENEM: 1.0000 | ENEMA | RECTAL | Status: DC | PRN

## 2013-11-04 MED ORDER — PHENYLEPHRINE 40 MCG/ML (10ML) SYRINGE FOR IV PUSH (FOR BLOOD PRESSURE SUPPORT)
PREFILLED_SYRINGE | INTRAVENOUS | Status: AC
  Filled 2013-11-04: qty 10

## 2013-11-04 MED ORDER — FENTANYL 2.5 MCG/ML BUPIVACAINE 1/10 % EPIDURAL INFUSION (WH - ANES)
INTRAMUSCULAR | Status: AC
  Filled 2013-11-04: qty 125

## 2013-11-04 MED ORDER — TETANUS-DIPHTH-ACELL PERTUSSIS 5-2.5-18.5 LF-MCG/0.5 IM SUSP: 0.5000 mL | Freq: Once | INTRAMUSCULAR | Status: DC

## 2013-11-04 MED ORDER — FENTANYL 2.5 MCG/ML BUPIVACAINE 1/10 % EPIDURAL INFUSION (WH - ANES)
14.0000 mL/h | INTRAMUSCULAR | Status: DC | PRN
  Administered 2013-11-04: 14 mL/h via EPIDURAL

## 2013-11-04 MED ORDER — LACTATED RINGERS IV SOLN
500.0000 mL | INTRAVENOUS | Status: DC | PRN
  Administered 2013-11-04: 1000 mL via INTRAVENOUS

## 2013-11-04 MED ORDER — PHENYLEPHRINE 40 MCG/ML (10ML) SYRINGE FOR IV PUSH (FOR BLOOD PRESSURE SUPPORT)
80.0000 ug | PREFILLED_SYRINGE | INTRAVENOUS | Status: DC | PRN
  Filled 2013-11-04: qty 2

## 2013-11-04 MED ORDER — BENZOCAINE-MENTHOL 20-0.5 % EX AERO
1.0000 "application " | INHALATION_SPRAY | CUTANEOUS | Status: DC | PRN
  Administered 2013-11-04: 1 via TOPICAL
  Filled 2013-11-04: qty 56

## 2013-11-04 MED ORDER — LIDOCAINE HCL (PF) 1 % IJ SOLN
INTRAMUSCULAR | Status: DC | PRN
  Administered 2013-11-04 (×2): 5 mL

## 2013-11-04 MED ORDER — ZOLPIDEM TARTRATE 5 MG PO TABS: 5.0000 mg | ORAL_TABLET | Freq: Every evening | ORAL | Status: DC | PRN

## 2013-11-04 MED ORDER — OXYTOCIN 40 UNITS IN LACTATED RINGERS INFUSION - SIMPLE MED
62.5000 mL/h | INTRAVENOUS | Status: DC
  Filled 2013-11-04: qty 1000

## 2013-11-04 MED ORDER — SIMETHICONE 80 MG PO CHEW: 80.0000 mg | CHEWABLE_TABLET | ORAL | Status: DC | PRN

## 2013-11-04 MED ORDER — PRENATAL MULTIVITAMIN CH
1.0000 | ORAL_TABLET | Freq: Every day | ORAL | Status: DC
  Administered 2013-11-05: 1 via ORAL
  Filled 2013-11-04: qty 1

## 2013-11-04 MED ORDER — OXYCODONE-ACETAMINOPHEN 5-325 MG PO TABS
1.0000 | ORAL_TABLET | ORAL | Status: DC | PRN
  Administered 2013-11-04 – 2013-11-06 (×5): 1 via ORAL
  Filled 2013-11-04 (×5): qty 1

## 2013-11-04 MED ORDER — LACTATED RINGERS IV SOLN
INTRAVENOUS | Status: DC
  Administered 2013-11-04: 12:00:00 via INTRAVENOUS

## 2013-11-04 MED ORDER — BUTORPHANOL TARTRATE 1 MG/ML IJ SOLN
1.0000 mg | INTRAMUSCULAR | Status: DC | PRN
Start: 2013-11-04 — End: 2013-11-04
  Administered 2013-11-04: 1 mg via INTRAVENOUS
  Filled 2013-11-04: qty 1

## 2013-11-04 MED ORDER — LIDOCAINE HCL (PF) 1 % IJ SOLN
30.0000 mL | INTRAMUSCULAR | Status: DC | PRN
  Filled 2013-11-04 (×2): qty 30

## 2013-11-04 MED ORDER — IBUPROFEN 600 MG PO TABS
600.0000 mg | ORAL_TABLET | Freq: Four times a day (QID) | ORAL | Status: DC
  Administered 2013-11-04 – 2013-11-06 (×7): 600 mg via ORAL
  Filled 2013-11-04 (×7): qty 1

## 2013-11-04 MED ORDER — SENNOSIDES-DOCUSATE SODIUM 8.6-50 MG PO TABS
2.0000 | ORAL_TABLET | ORAL | Status: DC
  Administered 2013-11-04 – 2013-11-05 (×2): 2 via ORAL
  Filled 2013-11-04 (×2): qty 2

## 2013-11-04 MED ORDER — EPHEDRINE 5 MG/ML INJ
10.0000 mg | INTRAVENOUS | Status: DC | PRN
  Filled 2013-11-04: qty 2

## 2013-11-04 MED ORDER — DIPHENHYDRAMINE HCL 25 MG PO CAPS: 25.0000 mg | ORAL_CAPSULE | Freq: Four times a day (QID) | ORAL | Status: DC | PRN

## 2013-11-04 MED ORDER — LANOLIN HYDROUS EX OINT: TOPICAL_OINTMENT | CUTANEOUS | Status: DC | PRN

## 2013-11-04 MED ORDER — WITCH HAZEL-GLYCERIN EX PADS: 1.0000 "application " | MEDICATED_PAD | CUTANEOUS | Status: DC | PRN
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
## 2013-11-04 NOTE — MAU Note (Signed)
Patient states she has been leaking clear fluid with occasional contractions this am. States she has not felt fetal movement this am.

## 2013-11-04 NOTE — Anesthesia Preprocedure Evaluation (Signed)

## 2013-11-04 NOTE — MAU Note (Signed)
Started leaking at 0700, clear fluid, still coming. Contractions started about later, has only had a few.  No problems with preg. Was 4 cm when last checked.

## 2013-11-04 NOTE — Anesthesia Procedure Notes (Signed)
Epidural Patient location during procedure: OB Start time: 11/04/2013 10:21 AM  Staffing Anesthesiologist: Brayton CavesJACKSON, Niklas Chretien Performed by: anesthesiologist   Preanesthetic Checklist Completed: patient identified, site marked, surgical consent, pre-op evaluation, timeout performed, IV checked, risks and benefits discussed and monitors and equipment checked  Epidural Patient position: sitting Prep: site prepped and draped and DuraPrep Patient monitoring: continuous pulse ox and blood pressure Approach: midline Injection technique: LOR air  Needle:  Needle type: Tuohy  Needle gauge: 17 G Needle length: 9 cm and 9 Needle insertion depth: 8 cm Catheter type: closed end flexible Catheter size: 19 Gauge Catheter at skin depth: 14 cm Test dose: negative  Assessment Events: blood not aspirated, injection not painful, no injection resistance, negative IV test and no paresthesia  Additional Notes Patient identified.  Risk benefits discussed including failed block, incomplete pain control, headache, nerve damage, paralysis, blood pressure changes, nausea, vomiting, reactions to medication both toxic or allergic, and postpartum back pain.  Patient expressed understanding and wished to proceed.  All questions were answered.  Sterile technique used throughout procedure and epidural site dressed with sterile barrier dressing. No paresthesia or other complications noted.The patient did not experience any signs of intravascular injection such as tinnitus or metallic taste in mouth nor signs of intrathecal spread such as rapid motor block. Please see nursing notes for vital signs.

## 2013-11-04 NOTE — MAU Note (Signed)
Lab here for draw at time of transfer, unable to get with IV start.

## 2013-11-05 LAB — CBC
HCT: 31.4 % — ABNORMAL LOW (ref 36.0–46.0)
Hemoglobin: 10.6 g/dL — ABNORMAL LOW (ref 12.0–15.0)
MCH: 27.5 pg (ref 26.0–34.0)
MCHC: 33.8 g/dL (ref 30.0–36.0)
MCV: 81.6 fL (ref 78.0–100.0)
Platelets: 128 K/uL — ABNORMAL LOW (ref 150–400)
RBC: 3.85 MIL/uL — ABNORMAL LOW (ref 3.87–5.11)
RDW: 12.9 % (ref 11.5–15.5)
WBC: 10.7 K/uL — ABNORMAL HIGH (ref 4.0–10.5)

## 2013-11-05 NOTE — Progress Notes (Signed)
Post Partum Day 1  Subjective: no complaints and tolerating PO  Objective: Blood pressure 121/78, pulse 69, temperature 98 F (36.7 C), temperature source Oral, resp. rate 17, height 5' 6.5" (1.689 m), weight 103.329 kg (227 lb 12.8 oz), last menstrual period 03/10/2013, SpO2 99.00%, unknown if currently breastfeeding.  Physical Exam:  General: alert and cooperative Lochia: appropriate Uterine Fundus: firm   Recent Labs  11/04/13 0911 11/05/13 0630  HGB 11.7* 10.6*  HCT 34.4* 31.4*    Assessment/Plan: Plan for discharge tomorrow   LOS: 1 day   Rhapsody Wolven W 11/05/2013, 8:47 AM

## 2013-11-05 NOTE — Anesthesia Postprocedure Evaluation (Signed)
Anesthesia Post Note  Patient: Patricia Villanueva  Procedure(s) Performed: * No procedures listed *  Anesthesia type: Epidural  Patient location: Mother/Baby  Post pain: Pain level controlled  Post assessment: Post-op Vital signs reviewed  Last Vitals:  Filed Vitals:   11/05/13 0536  BP: 121/78  Pulse: 69  Temp: 36.7 C  Resp: 17    Post vital signs: Reviewed  Level of consciousness:alert  Complications: No apparent anesthesia complications

## 2013-11-06 MED ORDER — IBUPROFEN 600 MG PO TABS: 600.0000 mg | ORAL_TABLET | Freq: Four times a day (QID) | ORAL | Status: DC | PRN

## 2013-11-06 MED ORDER — OXYCODONE-ACETAMINOPHEN 5-325 MG PO TABS: 1.0000 | ORAL_TABLET | Freq: Four times a day (QID) | ORAL | Status: DC | PRN

## 2013-11-06 NOTE — Progress Notes (Signed)
Patient ID: Patricia Villanueva, female   DOB: 1991/02/19, 23 y.o.   MRN: [id number] #2 afebrile BP normal for d/c

## 2013-11-06 NOTE — Discharge Instructions (Signed)
booklet °

## 2013-11-07 NOTE — Discharge Summary (Signed)
NAMJulianne Handler:  Villanueva, Patricia Villanueva                  ACCOUNT NO.:  [id number]  MEDICAL RECORD NO.:  [id number]  LOCATION:  9141                          FACILITY:  [facility]  PHYSICIAN:  Malachi Prohomas F. Ambrose MantleHenley, M.D. DATE OF BIRTH:  [birth year]  DATE OF ADMISSION:  11/04/2013 DATE OF DISCHARGE:  11/06/2013                              DISCHARGE SUMMARY   A 23 year old black female, para 0, gravida 1, at 38+ weeks gestation [facility] by last period compatible with a 9-week ultrasound presented with ruptured membranes and contractions, cervix 5 cm dilated on admission.  Group B strep was positive.  Ultrasound performed for fundal lag during the pregnancy showed the baby at 28th percentile with abdominal circumference only at the 9th percentile.  She was followed by nonstress test 2 times weekly which had been reassuring. Blood group and type, A positive.  Negative antibody.  Rubella nonimmune.  RPR nonreactive.  Hepatitis B surface antigen negative.  HIV negative.  Group B strep positive.  One-hour Glucola 129.  First trimester screen, AFP, and cystic fibrosis, all negative.  The patient was admitted, placed on penicillin, received an epidural.  She progressed to full dilatation, pushed well, and at 1:46 p.m., Dr. Senaida Oresichardson delivered a living female infant, Apgars of 8 and 9 at 1 and 5 minutes.  Postpartum, the patient did well and was discharged on the second postpartum day.  Initial hemoglobin 11.7, hematocrit 34.4, white count 7100, platelet count 140,000.  RPR nonreactive.  Follow up hemoglobin 10.6, platelet count 128,000.  FINAL DIAGNOSIS:  Intrauterine pregnancy at 38+ weeks, delivered vertex.  OPERATION:  Spontaneous delivery, vertex.  First-degree laceration repaired with 3-0 Vicryl Rapide.  FINAL CONDITION:  Improved.  INSTRUCTIONS:  Include our regular discharge instruction booklet as well as the after visit summary.  Prescriptions for Percocet 5/325 and Motrin 600 mg 30 of each, 1  every 6 hours as needed for pain is given at discharge.  She is to return in 6 weeks for followup examination.     Malachi Prohomas F. Ambrose MantleHenley, M.D.     TFH/MEDQ  D:  11/06/2013  T:  11/07/2013  Job:  [id number]

## 2013-11-08 ENCOUNTER — Inpatient Hospital Stay (HOSPITAL_COMMUNITY): Admission: RE | Admit: 2013-11-08 | Payer: No Typology Code available for payment source | Source: Ambulatory Visit

## 2014-08-01 ENCOUNTER — Encounter (HOSPITAL_COMMUNITY): Payer: Self-pay | Admitting: *Deleted

## 2014-10-13 ENCOUNTER — Encounter (HOSPITAL_COMMUNITY): Payer: Self-pay | Admitting: Orthopedic Surgery

## 2015-03-09 ENCOUNTER — Encounter (HOSPITAL_COMMUNITY): Payer: Self-pay | Admitting: Orthopedic Surgery

## 2015-10-24 ENCOUNTER — Emergency Department (HOSPITAL_COMMUNITY)
Admission: EM | Admit: 2015-10-24 | Discharge: 2015-10-24 | Disposition: A | Discharge status: Home / Self Care | Payer: Medicaid Other | Attending: Emergency Medicine | Admitting: Emergency Medicine

## 2015-10-24 ENCOUNTER — Encounter (HOSPITAL_COMMUNITY): Payer: Self-pay | Admitting: Emergency Medicine

## 2015-10-24 DIAGNOSIS — N739 Female pelvic inflammatory disease, unspecified: Secondary | ICD-10-CM

## 2015-10-24 DIAGNOSIS — Z3202 Encounter for pregnancy test, result negative: Secondary | ICD-10-CM | POA: Insufficient documentation

## 2015-10-24 LAB — CBC
HEMATOCRIT: 40.7 % (ref 36.0–46.0)
HEMOGLOBIN: 13.3 g/dL (ref 12.0–15.0)
MCH: 26.5 pg (ref 26.0–34.0)
MCHC: 32.7 g/dL (ref 30.0–36.0)
MCV: 81.1 fL (ref 78.0–100.0)
Platelets: 200 10*3/uL (ref 150–400)
RBC: 5.02 MIL/uL (ref 3.87–5.11)
RDW: 12.6 % (ref 11.5–15.5)
WBC: 12.4 10*3/uL — AB (ref 4.0–10.5)

## 2015-10-24 LAB — COMPREHENSIVE METABOLIC PANEL
ALBUMIN: 3 g/dL — AB (ref 3.5–5.0)
ALK PHOS: 48 U/L (ref 38–126)
ALT: 13 U/L — AB (ref 14–54)
AST: 14 U/L — AB (ref 15–41)
Anion gap: 9 (ref 5–15)
BILIRUBIN TOTAL: 0.4 mg/dL (ref 0.3–1.2)
BUN: 8 mg/dL (ref 6–20)
CALCIUM: 8.8 mg/dL — AB (ref 8.9–10.3)
CO2: 27 mmol/L (ref 22–32)
CREATININE: 0.76 mg/dL (ref 0.44–1.00)
Chloride: 106 mmol/L (ref 101–111)
GFR calc Af Amer: >60 mL/min (ref >60)
GLUCOSE: 110 mg/dL — AB (ref 65–99)
POTASSIUM: 3.6 mmol/L (ref 3.5–5.1)
Sodium: 142 mmol/L (ref 135–145)
TOTAL PROTEIN: 5.5 g/dL — AB (ref 6.5–8.1)

## 2015-10-24 LAB — URINALYSIS, ROUTINE W REFLEX MICROSCOPIC
Bilirubin Urine: NEGATIVE
Glucose, UA: NEGATIVE mg/dL
HGB URINE DIPSTICK: NEGATIVE
Ketones, ur: NEGATIVE mg/dL
Nitrite: NEGATIVE
PROTEIN: NEGATIVE mg/dL
SPECIFIC GRAVITY, URINE: 1.031 — AB (ref 1.005–1.030)
pH: 6 (ref 5.0–8.0)

## 2015-10-24 LAB — URINE MICROSCOPIC-ADD ON

## 2015-10-24 LAB — LIPASE, BLOOD: LIPASE: 22 U/L (ref 11–51)

## 2015-10-24 LAB — WET PREP, GENITAL
CLUE CELLS WET PREP: NONE SEEN
Sperm: NONE SEEN
Trich, Wet Prep: NONE SEEN
Yeast Wet Prep HPF POC: NONE SEEN

## 2015-10-24 LAB — POC URINE PREG, ED: PREG TEST UR: NEGATIVE

## 2015-10-24 MED ORDER — CEFTRIAXONE SODIUM 250 MG IJ SOLR
250.0000 mg | Freq: Once | INTRAMUSCULAR | Status: AC
  Administered 2015-10-24: 250 mg via INTRAMUSCULAR
  Filled 2015-10-24: qty 250

## 2015-10-24 MED ORDER — LIDOCAINE HCL (PF) 1 % IJ SOLN
0.9000 mL | Freq: Once | INTRAMUSCULAR | Status: AC
  Administered 2015-10-24: 0.9 mL
  Filled 2015-10-24: qty 5

## 2015-10-24 MED ORDER — ONDANSETRON 4 MG PO TBDP: 4.0000 mg | ORAL_TABLET | Freq: Three times a day (TID) | ORAL | Status: DC | PRN

## 2015-10-24 MED ORDER — DOXYCYCLINE HYCLATE 100 MG PO TABS
100.0000 mg | ORAL_TABLET | Freq: Once | ORAL | Status: AC
  Administered 2015-10-24: 100 mg via ORAL
  Filled 2015-10-24: qty 1

## 2015-10-24 MED ORDER — DOXYCYCLINE HYCLATE 100 MG PO CAPS: 100.0000 mg | ORAL_CAPSULE | Freq: Two times a day (BID) | ORAL | Status: DC

## 2015-10-24 MED ORDER — METRONIDAZOLE 500 MG PO TABS
500.0000 mg | ORAL_TABLET | Freq: Once | ORAL | Status: AC
  Administered 2015-10-24: 500 mg via ORAL
  Filled 2015-10-24: qty 1

## 2015-10-24 MED ORDER — HYDROCODONE-ACETAMINOPHEN 5-325 MG PO TABS
1.0000 | ORAL_TABLET | Freq: Once | ORAL | Status: AC
Start: 2015-10-24 — End: 2015-10-24
  Administered 2015-10-24: 1 via ORAL
  Filled 2015-10-24: qty 1

## 2015-10-24 MED ORDER — OXYCODONE-ACETAMINOPHEN 5-325 MG PO TABS: 1.0000 | ORAL_TABLET | Freq: Four times a day (QID) | ORAL | Status: DC | PRN

## 2015-10-24 MED ORDER — METRONIDAZOLE 500 MG PO TABS: 500.0000 mg | ORAL_TABLET | Freq: Two times a day (BID) | ORAL | Status: DC

## 2015-10-24 NOTE — ED Notes (Signed)
Pelvic cart to bedside and set up; patient in gown and undressed from waist down; visitor at bedside

## 2015-10-24 NOTE — ED Notes (Signed)
PA at bedside.

## 2015-10-24 NOTE — ED Provider Notes (Signed)
CSN: [id number]     Arrival date & time 10/24/15  8416 History   First MD Initiated Contact with Patient 10/24/15 1151     Chief Complaint  Patient presents with  . Abdominal Pain     (Consider location/radiation/quality/duration/timing/severity/associated sxs/prior Treatment) HPI Comments: Patient presents with several days of lower abdominal pain described as cramping with radiation into her bilateral legs and into the middle of her back. Pain is in the center of her lower abdomen. She has had mild dysuria, no hematuria. She denies vaginal discharge or bleeding. Most recent menstrual period was heavier than normal. No fevers, vomiting, diarrhea. No treatments prior to arrival. No history of abdominal surgeries. She is sexually active with one partner. The onset of this condition was acute. The course is constant. Aggravating factors: none. Alleviating factors: none.    The history is provided by the patient.    Past Medical History  Diagnosis Date  . Medical history non-contributory    Past Surgical History  Procedure Laterality Date  . Dental surgery    . Nerve exploration  04/04/2012    Procedure: NERVE EXPLORATION;  Surgeon: Sharma Covert, MD;  Location: [facility] OR;  Service: Orthopedics;  Laterality: Left;  Left Arm Laceration with Nerve Repair  . Arm surgery      left arm surgery to repair a nerve   Family History  Problem Relation Age of Onset  . Hypertension Mother   . Alcohol abuse Neg Hx   . Arthritis Neg Hx   . Birth defects Neg Hx   . Asthma Neg Hx   . Cancer Neg Hx   . COPD Neg Hx   . Diabetes Neg Hx   . Depression Neg Hx   . Drug abuse Neg Hx   . Early death Neg Hx   . Hearing loss Neg Hx   . Heart disease Neg Hx   . Hyperlipidemia Neg Hx   . Kidney disease Neg Hx   . Learning disabilities Neg Hx   . Mental illness Neg Hx   . Mental retardation Neg Hx   . Miscarriages / Stillbirths Neg Hx   . Stroke Neg Hx   . Vision loss Neg Hx    Social History   Substance Use Topics  . Smoking status: Never Smoker   . Smokeless tobacco: Never Used  . Alcohol Use: No     Comment: occasiona, none nowl   OB History    Gravida Para Term Preterm AB TAB SAB Ectopic Multiple Living   0 0 0 0 0 0 1     Review of Systems  Constitutional: Negative for fever.  HENT: Negative for rhinorrhea and sore throat.   Eyes: Negative for redness.  Respiratory: Negative for cough.   Cardiovascular: Negative for chest pain.  Gastrointestinal: Positive for abdominal pain. Negative for nausea, vomiting and diarrhea.  Genitourinary: Positive for dysuria and pelvic pain. Negative for vaginal bleeding and vaginal discharge.  Musculoskeletal: Negative for myalgias.  Skin: Negative for rash.  Neurological: Negative for headaches.    Allergies  Review of patient's allergies indicates no known allergies.  Home Medications   Prior to Admission medications   Medication Sig Start Date End Date Taking? Authorizing Provider  ibuprofen (ADVIL,MOTRIN) 600 MG tablet Take 1 tablet (600 mg total) by mouth every 6 (six) hours as needed. Patient not taking: Reported on 10/24/2015 11/06/13   Tracey Harries, MD  oxyCODONE-acetaminophen (PERCOCET/ROXICET) 5-325 MG per tablet Take 1 tablet by  mouth every 6 (six) hours as needed for severe pain (moderate - severe pain). Patient not taking: Reported on 10/24/2015 11/06/13   Tracey Harries, MD  Prenatal Vit-Fe Fumarate-FA (PRENATAL VITAMINS) 28-0.8 MG TABS Take 1 tablet by mouth daily. Patient not taking: Reported on 10/24/2015 04/01/13   Aviva Signs, CNM   BP 115/60 mmHg  Pulse 81  Temp(Src) 98.1 F (36.7 C) (Oral)  Resp 16  Ht  (1.676 m)  Wt 100.699 kg  BMI 35.85 kg/m2  SpO2 98%  LMP 10/03/2015   Physical Exam  Constitutional: She appears well-developed and well-nourished.  HENT:  Head: Normocephalic and atraumatic.  Eyes: Conjunctivae are normal. Right eye exhibits no discharge. Left eye exhibits no discharge.   Neck: Normal range of motion. Neck supple.  Cardiovascular: Normal rate, regular rhythm and normal heart sounds.   Pulmonary/Chest: Effort normal and breath sounds normal.  Abdominal: Soft. Bowel sounds are normal. There is tenderness in the suprapubic area. There is no rebound and no guarding.  Genitourinary: Uterus is tender. Cervix exhibits motion tenderness and discharge (slight green). Cervix exhibits no friability. Right adnexum displays tenderness. Right adnexum displays no mass. Left adnexum displays tenderness. Left adnexum displays no mass.  Neurological: She is alert.  Skin: Skin is warm and dry.  Psychiatric: She has a normal mood and affect.  Nursing note and vitals reviewed.   ED Course  Procedures (including critical care time) Labs Review Labs Reviewed  WET PREP, GENITAL - Abnormal; Notable for the following:    WBC, Wet Prep HPF POC TOO NUMEROUS TO COUNT (*)    All other components within normal limits  URINALYSIS, ROUTINE W REFLEX MICROSCOPIC (NOT AT [facility]) - Abnormal; Notable for the following:    APPearance CLOUDY (*)    Specific Gravity, Urine 1.031 (*)    Leukocytes, UA SMALL (*)    All other components within normal limits  CBC - Abnormal; Notable for the following:    WBC 12.4 (*)    All other components within normal limits  COMPREHENSIVE METABOLIC PANEL - Abnormal; Notable for the following:    Glucose, Bld 110 (*)    Calcium 8.8 (*)    Total Protein 5.5 (*)    Albumin 3.0 (*)    AST 14 (*)    ALT 13 (*)    All other components within normal limits  URINE MICROSCOPIC-ADD ON - Abnormal; Notable for the following:    Squamous Epithelial / LPF 6-30 (*)    Bacteria, UA RARE (*)    All other components within normal limits  LIPASE, BLOOD  POC URINE PREG, ED  GC/CHLAMYDIA PROBE AMP ([place]) NOT AT [facility]    Imaging Review No results found. I have personally reviewed and evaluated these images and lab results as part of my medical  decision-making.   EKG Interpretation None       12:35 PM Patient seen and examined. Work-up initiated.   Vital signs reviewed and are as follows: BP 115/60 mmHg  Pulse 81  Temp(Src) 98.1 F (36.7 C) (Oral)  Resp 16  Ht  (1.676 m)  Wt 100.699 kg  BMI 35.85 kg/m2  SpO2 98%  LMP 10/03/2015  Pelvic exam performed by Katrinka Blazing PA-S2 under my supervision. Performed with NT chaperone.   Patient was CMT with bilateral adnexal pain R=L. Low concern for TOA, no fever.   2:27 PM Will treat patient as PID. First dose medications given here. Patient has OB/GYN, I encouraged appt and  follow-up in 1 week.   3:46 PM patient has tolerated first dose of antibiotics here in emergency department. Will discharge home with close GYN follow-up.   The patient was urged to return to the Emergency Department immediately with worsening of current symptoms, worsening abdominal pain, persistent vomiting, blood noted in stools, fever, or any other concerns. The patient verbalized understanding.   Patient counseled on use of narcotic pain medications. Counseled not to combine these medications with others containing tylenol. Urged not to drink alcohol, drive, or perform any other activities that requires focus while taking these medications. The patient verbalizes understanding and agrees with the plan.   MDM   Final diagnoses:  Pelvic inflammatory disease (PID)   Patient with probable diagnosis of PID. She has pelvic pain with cervical motion tenderness, too numerous to count white blood cells on wet prep. She is sexually active but does not appear to have a high risk sexual history. Patient treated with Rocephin in the emergency department and discharged home on doxycycline and metronidazole. Adnexal tenderness is not unilateral. She does not any fever. I do not suspect a tubo-ovarian abscess. Patient has GYN follow-up. Otherwise, abdomen is nontender. Do not suspect appendicitis or other intra-abdominal  etiology given tenderness on exam. Return instructions as above. Patient seems reliable to return with worsening.   Renne Crigler, PA-C 10/24/15 1548  Laurence Spates, MD 10/24/15 [phone]

## 2015-10-24 NOTE — ED Notes (Signed)
C/o/ abd pain since Sunday, no V/D/F, some dysuria, no discharge, A/O X4, ambulatory and in NAD

## 2015-10-24 NOTE — Discharge Instructions (Signed)
Please read and follow all provided instructions.  Your diagnoses today include:  1. Pelvic inflammatory disease (PID)     Tests performed today include:  Blood counts and electrolytes  Blood tests to check liver and kidney function  Urine test to look for infection and pregnancy (in women)  Wet prep - shows a lot of infection fighting cells but no other vaginal infection  Vital signs. See below for your results today.   Medications prescribed:   Doxycycline - antibiotic  You have been prescribed an antibiotic medicine: take the entire course of medicine even if you are feeling better. Stopping early can cause the antibiotic not to work.   Metronidazole - antibiotic  You have been prescribed an antibiotic medicine: take the entire course of medicine even if you are feeling better. Stopping early can cause the antibiotic not to work. Do not drink alcohol when taking this medication.    Percocet (oxycodone/acetaminophen) - narcotic pain medication  DO NOT drive or perform any activities that require you to be awake and alert because this medicine can make you drowsy. BE VERY CAREFUL not to take multiple medicines containing Tylenol (also called acetaminophen). Doing so can lead to an overdose which can damage your liver and cause liver failure and possibly death.   Zofran (ondansetron) - for nausea and vomiting  Take any prescribed medications only as directed.  Home care instructions:   Follow any educational materials contained in this packet.  Follow-up instructions: Please follow-up with your gynecologist in 7 days for further evaluation of your symptoms and recheck.   Return instructions:  SEEK IMMEDIATE MEDICAL ATTENTION IF:  The pain does not go away or becomes severe   A temperature above 101F develops   Repeated vomiting occurs (multiple episodes)   The pain becomes localized to portions of the abdomen. The right side could possibly be appendicitis. In an  adult, the left lower portion of the abdomen could be colitis or diverticulitis.   Blood is being passed in stools or vomit (bright red or black tarry stools)   You develop chest pain, difficulty breathing, dizziness or fainting, or become confused, poorly responsive, or inconsolable (young children)  If you have any other emergent concerns regarding your health  Additional Information: Abdominal (belly) pain can be caused by many things. Your caregiver performed an examination and possibly ordered blood/urine tests and imaging (CT scan, x-rays, ultrasound). Many cases can be observed and treated at home after initial evaluation in the emergency department. Even though you are being discharged home, abdominal pain can be unpredictable. Therefore, you need a repeated exam if your pain does not resolve, returns, or worsens. Most patients with abdominal pain don't have to be admitted to the hospital or have surgery, but serious problems like appendicitis and gallbladder attacks can start out as nonspecific pain. Many abdominal conditions cannot be diagnosed in one visit, so follow-up evaluations are very important.  Your vital signs today were: BP 104/67 mmHg   Pulse 63   Temp(Src) 98.1 F (36.7 C) (Oral)   Resp 16   Ht  (1.676 m)   Wt 100.699 kg   BMI 35.85 kg/m2   SpO2 100%   LMP 10/03/2015 If your blood pressure (bp) was elevated above 135/85 this visit, please have this repeated by your doctor within one month. --------------

## 2015-10-25 LAB — GC/CHLAMYDIA PROBE AMP (~~LOC~~) NOT AT ARMC
CHLAMYDIA, DNA PROBE: NEGATIVE
NEISSERIA GONORRHEA: NEGATIVE

## 2016-03-30 ENCOUNTER — Encounter (HOSPITAL_COMMUNITY): Payer: Self-pay | Admitting: Emergency Medicine

## 2016-03-30 ENCOUNTER — Emergency Department (HOSPITAL_COMMUNITY)
Admission: EM | Admit: 2016-03-30 | Discharge: 2016-03-30 | Disposition: A | Discharge status: Home / Self Care | Payer: No Typology Code available for payment source | Attending: Emergency Medicine | Admitting: Emergency Medicine

## 2016-03-30 DIAGNOSIS — F17200 Nicotine dependence, unspecified, uncomplicated: Secondary | ICD-10-CM | POA: Insufficient documentation

## 2016-03-30 DIAGNOSIS — K0889 Other specified disorders of teeth and supporting structures: Secondary | ICD-10-CM | POA: Insufficient documentation

## 2016-03-30 MED ORDER — PENICILLIN V POTASSIUM 250 MG PO TABS
500.0000 mg | ORAL_TABLET | Freq: Once | ORAL | Status: AC
  Administered 2016-03-30: 500 mg via ORAL
  Filled 2016-03-30: qty 2

## 2016-03-30 MED ORDER — NAPROXEN 250 MG PO TABS
500.0000 mg | ORAL_TABLET | Freq: Once | ORAL | Status: AC
  Administered 2016-03-30: 500 mg via ORAL
  Filled 2016-03-30: qty 2

## 2016-03-30 MED ORDER — PENICILLIN V POTASSIUM 500 MG PO TABS: 500.0000 mg | ORAL_TABLET | Freq: Four times a day (QID) | ORAL | Status: DC

## 2016-03-30 MED ORDER — NAPROXEN 500 MG PO TABS: 500.0000 mg | ORAL_TABLET | Freq: Two times a day (BID) | ORAL | Status: DC

## 2016-03-30 NOTE — ED Notes (Signed)
Pt. reports right upper molar pain onset today unrelieved by OTC pain medication .

## 2016-03-30 NOTE — Discharge Instructions (Signed)
Take the prescribed medication as directed. Follow-up with your dentist next week. Return to the ED for new or worsening symptoms.

## 2016-03-30 NOTE — ED Provider Notes (Signed)
CSN: [id number]     Arrival date & time 03/30/16  2041 History   First MD Initiated Contact with Patient 03/30/16 2117     Chief Complaint  Patient presents with  . Dental Pain     (Consider location/radiation/quality/duration/timing/severity/associated sxs/prior Treatment) Patient is a 25 y.o. female presenting with tooth pain. The history is provided by the patient and medical records.  Dental Pain   25 year old female with no significant past medical history presenting to the ED for right upper dental pain, onset today. She reports there is a "hole" in her right upper molar which is causing sharp, shooting pains in the right side of face. States pain is worse when anything touches her tooth. States this has been there for quite some time but is not bothered her until today. She has tried over-the-counter pain medication without relief. [facility], however their office is closed today. No fever or chills. No facial or neck swelling. No difficulty swallowing.  Past Medical History  Diagnosis Date  . Medical history non-contributory    Past Surgical History  Procedure Laterality Date  . Dental surgery    . Nerve exploration  04/04/2012    Procedure: NERVE EXPLORATION;  Surgeon: Sharma CovertFred W Ortmann, MD;  [facility];  Service: Orthopedics;  Laterality: Left;  Left Arm Laceration with Nerve Repair  . Arm surgery      left arm surgery to repair a nerve  . Arm surgery     Family History  Problem Relation Age of Onset  . Hypertension Mother   . Alcohol abuse Neg Hx   . Arthritis Neg Hx   . Birth defects Neg Hx   . Asthma Neg Hx   . Cancer Neg Hx   . COPD Neg Hx   . Diabetes Neg Hx   . Depression Neg Hx   . Drug abuse Neg Hx   . Early death Neg Hx   . Hearing loss Neg Hx   . Heart disease Neg Hx   . Hyperlipidemia Neg Hx   . Kidney disease Neg Hx   . Learning disabilities Neg Hx   . Mental illness Neg Hx   . Mental retardation Neg Hx   . Miscarriages /  Stillbirths Neg Hx   . Stroke Neg Hx   . Vision loss Neg Hx    Social History  Substance Use Topics  . Smoking status: Current Every Day Smoker  . Smokeless tobacco: Never Used  . Alcohol Use: Yes   OB History    Gravida Para Term Preterm AB TAB SAB Ectopic Multiple Living   1 1 1  0 0 0 0 0 0 1     Review of Systems  HENT: Positive for dental problem.   All other systems reviewed and are negative.     Allergies  Review of patient's allergies indicates no known allergies.  Home Medications   Prior to Admission medications   Medication Sig Start Date End Date Taking? Authorizing Provider  doxycycline (VIBRAMYCIN) 100 MG capsule Take 1 capsule (100 mg total) by mouth 2 (two) times daily. 10/24/15   Renne CriglerJoshua Geiple, PA-C  metroNIDAZOLE (FLAGYL) 500 MG tablet Take 1 tablet (500 mg total) by mouth 2 (two) times daily. 10/24/15   Renne CriglerJoshua Geiple, PA-C  ondansetron (ZOFRAN ODT) 4 MG disintegrating tablet Take 1 tablet (4 mg total) by mouth every 8 (eight) hours as needed for nausea or vomiting. 10/24/15   Renne CriglerJoshua Geiple, PA-C  oxyCODONE-acetaminophen (PERCOCET/ROXICET) 5-325 MG  tablet Take 1-2 tablets by mouth every 6 (six) hours as needed for severe pain. 10/24/15   Renne CriglerJoshua Geiple, PA-C   BP 157/95 mmHg  Pulse 61  Temp(Src) 98.8 F (37.1 C) (Oral)  Resp 20  Ht 5\' 7"  (1.702 m)  Wt 98.062 kg  BMI 33.85 kg/m2  SpO2 100%   Physical Exam  Constitutional: She is oriented to person, place, and time. She appears well-developed and well-nourished. No distress.  HENT:  Head: Normocephalic and atraumatic.  Mouth/Throat: Oropharynx is clear and moist.  Teeth largely in fair dentition, right upper molar with small cavity present, surrounding gingiva overall normal in appearance, handling secretions appropriately, no trismus, no facial or neck swelling, normal phonation without stridor  Eyes: Conjunctivae and EOM are normal. Pupils are equal, round, and reactive to light.  Neck: Normal range of  motion. Neck supple.  Cardiovascular: Normal rate, regular rhythm and normal heart sounds.   Pulmonary/Chest: Effort normal and breath sounds normal. No respiratory distress. She has no wheezes.  Abdominal: Soft. Bowel sounds are normal. There is no tenderness. There is no guarding.  Musculoskeletal: Normal range of motion. She exhibits no edema.  Neurological: She is alert and oriented to person, place, and time.  Skin: Skin is warm and dry. She is not diaphoretic.  Psychiatric: She has a normal mood and affect.  Nursing note and vitals reviewed.   ED Course  Procedures (including critical care time) Labs Review Labs Reviewed - No data to display  Imaging Review No results found. I have personally reviewed and evaluated these images and lab results as part of my medical decision-making.   EKG Interpretation None      MDM   Final diagnoses:  Pain, dental   25 year old female here with right upper dental pain. This appears to be secondary to a cavity present. Chest no surrounding gingival swelling or edema concerning for abscess. She has no facial or neck swelling, handling secretions well. Not clinically consistent with Ludwig's angina.  Will start on ppx abx and naprosyn for pain.  FU with dentist on Monday.  Discussed plan with patient, he/she acknowledged understanding and agreed with plan of care.  Return precautions given for new or worsening symptoms.  Garlon HatchetLisa M Quamesha Mullet, PA-C 03/30/16 2155  Alvira MondayErin Schlossman, MD 03/31/16 [phone]

## 2016-08-29 ENCOUNTER — Inpatient Hospital Stay (HOSPITAL_COMMUNITY)
Admission: AD | Admit: 2016-08-29 | Discharge: 2016-08-29 | Disposition: A | Discharge status: Home / Self Care | Payer: Self-pay | Source: Ambulatory Visit | Attending: Obstetrics and Gynecology | Admitting: Obstetrics and Gynecology

## 2016-08-29 ENCOUNTER — Encounter (HOSPITAL_COMMUNITY): Payer: Self-pay | Admitting: *Deleted

## 2016-08-29 ENCOUNTER — Inpatient Hospital Stay (HOSPITAL_COMMUNITY): Payer: Self-pay

## 2016-08-29 DIAGNOSIS — O26899 Other specified pregnancy related conditions, unspecified trimester: Secondary | ICD-10-CM

## 2016-08-29 DIAGNOSIS — R109 Unspecified abdominal pain: Secondary | ICD-10-CM

## 2016-08-29 DIAGNOSIS — O99282 Endocrine, nutritional and metabolic diseases complicating pregnancy, second trimester: Secondary | ICD-10-CM | POA: Insufficient documentation

## 2016-08-29 DIAGNOSIS — R1030 Lower abdominal pain, unspecified: Secondary | ICD-10-CM | POA: Insufficient documentation

## 2016-08-29 DIAGNOSIS — Z3491 Encounter for supervision of normal pregnancy, unspecified, first trimester: Secondary | ICD-10-CM

## 2016-08-29 DIAGNOSIS — O26851 Spotting complicating pregnancy, first trimester: Secondary | ICD-10-CM

## 2016-08-29 DIAGNOSIS — O219 Vomiting of pregnancy, unspecified: Secondary | ICD-10-CM

## 2016-08-29 DIAGNOSIS — E860 Dehydration: Secondary | ICD-10-CM

## 2016-08-29 DIAGNOSIS — Z3A00 Weeks of gestation of pregnancy not specified: Secondary | ICD-10-CM | POA: Insufficient documentation

## 2016-08-29 DIAGNOSIS — Z79899 Other long term (current) drug therapy: Secondary | ICD-10-CM | POA: Insufficient documentation

## 2016-08-29 DIAGNOSIS — O26859 Spotting complicating pregnancy, unspecified trimester: Secondary | ICD-10-CM

## 2016-08-29 DIAGNOSIS — O26892 Other specified pregnancy related conditions, second trimester: Secondary | ICD-10-CM | POA: Insufficient documentation

## 2016-08-29 LAB — URINALYSIS, ROUTINE W REFLEX MICROSCOPIC
Bilirubin Urine: NEGATIVE
GLUCOSE, UA: NEGATIVE mg/dL
Ketones, ur: 15 mg/dL — AB
NITRITE: NEGATIVE
PH: 6 (ref 5.0–8.0)
Protein, ur: NEGATIVE mg/dL
SPECIFIC GRAVITY, URINE: 1.025 (ref 1.005–1.030)

## 2016-08-29 LAB — COMPREHENSIVE METABOLIC PANEL
ALK PHOS: 54 U/L (ref 38–126)
ALT: 12 U/L — AB (ref 14–54)
AST: 15 U/L (ref 15–41)
Albumin: 3.9 g/dL (ref 3.5–5.0)
Anion gap: 7 (ref 5–15)
BUN: 8 mg/dL (ref 6–20)
CHLORIDE: 105 mmol/L (ref 101–111)
CO2: 24 mmol/L (ref 22–32)
CREATININE: 0.63 mg/dL (ref 0.44–1.00)
Calcium: 9.4 mg/dL (ref 8.9–10.3)
GFR calc Af Amer: >60 mL/min (ref >60)
Glucose, Bld: 90 mg/dL (ref 65–99)
Potassium: 4.2 mmol/L (ref 3.5–5.1)
Sodium: 136 mmol/L (ref 135–145)
Total Bilirubin: 0.4 mg/dL (ref 0.3–1.2)
Total Protein: 7.1 g/dL (ref 6.5–8.1)

## 2016-08-29 LAB — CBC
HEMATOCRIT: 37 % (ref 36.0–46.0)
HEMOGLOBIN: 12.5 g/dL (ref 12.0–15.0)
MCH: 27 pg (ref 26.0–34.0)
MCHC: 33.8 g/dL (ref 30.0–36.0)
MCV: 79.9 fL (ref 78.0–100.0)
Platelets: 215 10*3/uL (ref 150–400)
RBC: 4.63 MIL/uL (ref 3.87–5.11)
RDW: 12.7 % (ref 11.5–15.5)
WBC: 8 10*3/uL (ref 4.0–10.5)

## 2016-08-29 LAB — URINE MICROSCOPIC-ADD ON

## 2016-08-29 LAB — WET PREP, GENITAL
Clue Cells Wet Prep HPF POC: NONE SEEN
Sperm: NONE SEEN
Trich, Wet Prep: NONE SEEN
Yeast Wet Prep HPF POC: NONE SEEN

## 2016-08-29 LAB — HCG, QUANTITATIVE, PREGNANCY: HCG, BETA CHAIN, QUANT, S: 92428 m[IU]/mL — AB (ref <5)

## 2016-08-29 LAB — POCT PREGNANCY, URINE: Preg Test, Ur: POSITIVE — AB

## 2016-08-29 MED ORDER — LACTATED RINGERS IV BOLUS (SEPSIS)
1000.0000 mL | Freq: Once | INTRAVENOUS | Status: AC
  Administered 2016-08-29: 1000 mL via INTRAVENOUS

## 2016-08-29 MED ORDER — M.V.I. ADULT IV INJ
Freq: Once | INTRAVENOUS | Status: AC
  Administered 2016-08-29: 16:00:00 via INTRAVENOUS
  Filled 2016-08-29: qty 10

## 2016-08-29 MED ORDER — PROMETHAZINE HCL 25 MG/ML IJ SOLN
12.5000 mg | Freq: Once | INTRAMUSCULAR | Status: AC
  Administered 2016-08-29: 12.5 mg via INTRAVENOUS
  Filled 2016-08-29: qty 1

## 2016-08-29 MED ORDER — PROMETHAZINE HCL 12.5 MG PO TABS: 12.5000 mg | ORAL_TABLET | Freq: Four times a day (QID) | ORAL | 0 refills | Status: DC | PRN

## 2016-08-29 MED ORDER — FAMOTIDINE IN NACL 20-0.9 MG/50ML-% IV SOLN
20.0000 mg | Freq: Once | INTRAVENOUS | Status: AC
  Administered 2016-08-29: 20 mg via INTRAVENOUS
  Filled 2016-08-29: qty 50

## 2016-08-29 NOTE — MAU Provider Note (Signed)
History     CSN: [id number]  Arrival date and time: 08/29/16 1213   First Provider Initiated Contact with Patient 08/29/16 1408      Chief Complaint  Patient presents with  . Nausea   G2P1001 at unknown gestation here with nausea x5 days. She denies vomiting. She reports loose stools x3 days. She denies sick contacts. She reports pink spotting 3 days ago x1 episode. She denies vaginal discharge. She reports lower abdominal pain 3 days ago for which she took Ibuprofen and pain resolved.    OB History    Gravida Para Term Preterm AB Living   2 1 1  0 0 1   SAB TAB Ectopic Multiple Live Births   0 0 0 0 1      Past Medical History:  Diagnosis Date  . Medical history non-contributory     Past Surgical History:  Procedure Laterality Date  . arm surgery     left arm surgery to repair a nerve  . arm surgery    . DENTAL SURGERY    . NERVE EXPLORATION  04/04/2012   Procedure: NERVE EXPLORATION;  Surgeon: Sharma CovertFred W Ortmann, MD;  [facility] OR;  Service: Orthopedics;  Laterality: Left;  Left Arm Laceration with Nerve Repair    Family History  Problem Relation Age of Onset  . Hypertension Mother   . Alcohol abuse Neg Hx   . Arthritis Neg Hx   . Birth defects Neg Hx   . Asthma Neg Hx   . Cancer Neg Hx   . COPD Neg Hx   . Diabetes Neg Hx   . Depression Neg Hx   . Drug abuse Neg Hx   . Early death Neg Hx   . Hearing loss Neg Hx   . Heart disease Neg Hx   . Hyperlipidemia Neg Hx   . Kidney disease Neg Hx   . Learning disabilities Neg Hx   . Mental illness Neg Hx   . Mental retardation Neg Hx   . Miscarriages / Stillbirths Neg Hx   . Stroke Neg Hx   . Vision loss Neg Hx     Social History  Substance Use Topics  . Smoking status: Never Smoker  . Smokeless tobacco: Never Used  . Alcohol use Yes    Allergies: No Known Allergies  Prescriptions Prior to Admission  Medication Sig Dispense Refill Last Dose  . ibuprofen (ADVIL,MOTRIN) 200 MG tablet Take 200 mg by mouth  every 6 (six) hours as needed for mild pain.   Past Week at Unknown time  . doxycycline (VIBRAMYCIN) 100 MG capsule Take 1 capsule (100 mg total) by mouth 2 (two) times daily. (Patient not taking: Reported on 08/29/2016) 28 capsule 0 Not Taking at Unknown time  . metroNIDAZOLE (FLAGYL) 500 MG tablet Take 1 tablet (500 mg total) by mouth 2 (two) times daily. (Patient not taking: Reported on 08/29/2016) 28 tablet 0 Not Taking at Unknown time  . naproxen (NAPROSYN) 500 MG tablet Take 1 tablet (500 mg total) by mouth 2 (two) times daily with a meal. (Patient not taking: Reported on 08/29/2016) 30 tablet 0 Not Taking at Unknown time  . ondansetron (ZOFRAN ODT) 4 MG disintegrating tablet Take 1 tablet (4 mg total) by mouth every 8 (eight) hours as needed for nausea or vomiting. (Patient not taking: Reported on 08/29/2016) 10 tablet 0 Not Taking at Unknown time  . oxyCODONE-acetaminophen (PERCOCET/ROXICET) 5-325 MG tablet Take 1-2 tablets by mouth every 6 (six) hours as needed  for severe pain. (Patient not taking: Reported on 08/29/2016) 10 tablet 0 Not Taking at Unknown time  . penicillin v potassium (VEETID) 500 MG tablet Take 1 tablet (500 mg total) by mouth 4 (four) times daily. (Patient not taking: Reported on 08/29/2016) 40 tablet 0 Not Taking at Unknown time    Review of Systems  Constitutional: Negative.   Gastrointestinal: Positive for abdominal pain and nausea. Negative for constipation, diarrhea and vomiting.  Genitourinary: Negative.    Physical Exam   Blood pressure 134/61, pulse 76, temperature 98.9 F (37.2 C), temperature source Oral, resp. rate 18, height 5\' 5"  (1.651 m), weight 90.3 kg (199 lb), last menstrual period 06/25/2016, unknown if currently breastfeeding.  Physical Exam  Constitutional: She is oriented to person, place, and time. She appears well-developed and well-nourished. No distress (appears comfortable).  HENT:  Head: Normocephalic and atraumatic.  Neck: Normal range  of motion.  Cardiovascular: Normal rate.   Respiratory: Effort normal.  GI: Soft. She exhibits no distension and no mass. There is no tenderness. There is no rebound and no guarding.  Genitourinary:  Genitourinary Comments: External: no lesions or erythema Vagina: rugated, parous, scant white discharge Uterus: non enlarged, anteverted, non tender, no CMT Adnexae: no masses, no tenderness left, no tenderness right   Musculoskeletal: Normal range of motion.  Neurological: She is alert and oriented to person, place, and time.  Skin: Skin is warm and dry.  Psychiatric: She has a normal mood and affect.   Results for orders placed or performed during the hospital encounter of 08/29/16 (from the past 24 hour(s))  Urinalysis, Routine w reflex microscopic (not at [facility])     Status: Abnormal   Collection Time: 08/29/16  1:09 PM  Result Value Ref Range   Color, Urine YELLOW YELLOW   APPearance CLEAR CLEAR   Specific Gravity, Urine 1.025 1.005 - 1.030   pH 6.0 5.0 - 8.0   Glucose, UA NEGATIVE NEGATIVE mg/dL   Hgb urine dipstick MODERATE (A) NEGATIVE   Bilirubin Urine NEGATIVE NEGATIVE   Ketones, ur 15 (A) NEGATIVE mg/dL   Protein, ur NEGATIVE NEGATIVE mg/dL   Nitrite NEGATIVE NEGATIVE   Leukocytes, UA SMALL (A) NEGATIVE  Urine microscopic-add on     Status: Abnormal   Collection Time: 08/29/16  1:09 PM  Result Value Ref Range   Squamous Epithelial / LPF 6-30 (A) NONE SEEN   WBC, UA 6-30 0 - 5 WBC/hpf   RBC / HPF 0-5 0 - 5 RBC/hpf   Bacteria, UA FEW (A) NONE SEEN   Urine-Other MUCOUS PRESENT   Pregnancy, urine POC     Status: Abnormal   Collection Time: 08/29/16  1:16 PM  Result Value Ref Range   Preg Test, Ur POSITIVE (A) NEGATIVE  CBC     Status: None   Collection Time: 08/29/16  2:29 PM  Result Value Ref Range   WBC 8.0 4.0 - 10.5 K/uL   RBC 4.63 3.87 - 5.11 MIL/uL   Hemoglobin 12.5 12.0 - 15.0 g/dL   HCT 30.837.0 65.736.0 - 84.646.0 %   MCV 79.9 78.0 - 100.0 fL   MCH 27.0 26.0 - 34.0 pg    MCHC 33.8 30.0 - 36.0 g/dL   RDW 96.212.7 95.211.5 - 84.115.5 %   Platelets 215 150 - 400 K/uL  hCG, quantitative, pregnancy     Status: Abnormal   Collection Time: 08/29/16  2:29 PM  Result Value Ref Range   hCG, Beta Chain, Quant, S 92,428 (H) <5  mIU/mL  Comprehensive metabolic panel     Status: Abnormal   Collection Time: 08/29/16  2:29 PM  Result Value Ref Range   Sodium 136 135 - 145 mmol/L   Potassium 4.2 3.5 - 5.1 mmol/L   Chloride 105 101 - 111 mmol/L   CO2 24 22 - 32 mmol/L   Glucose, Bld 90 65 - 99 mg/dL   BUN 8 6 - 20 mg/dL   Creatinine, Ser 1.61 0.44 - 1.00 mg/dL   Calcium 9.4 8.9 - 09.6 mg/dL   Total Protein 7.1 6.5 - 8.1 g/dL   Albumin 3.9 3.5 - 5.0 g/dL   AST 15 15 - 41 U/L   ALT 12 (L) 14 - 54 U/L   Alkaline Phosphatase 54 38 - 126 U/L   Total Bilirubin 0.4 0.3 - 1.2 mg/dL   GFR calc non Af Amer >60 >60 mL/min   GFR calc Af Amer >60 >60 mL/min   Anion gap 7 5 - 15  Wet prep, genital     Status: Abnormal   Collection Time: 08/29/16  3:19 PM  Result Value Ref Range   Yeast Wet Prep HPF POC NONE SEEN NONE SEEN   Trich, Wet Prep NONE SEEN NONE SEEN   Clue Cells Wet Prep HPF POC NONE SEEN NONE SEEN   WBC, Wet Prep HPF POC MODERATE (A) NONE SEEN   Sperm NONE SEEN    US Ob Comp Less 14 Wks  Result Date: 08/29/2016 CLINICAL DATA:  Vaginal bleeding beginning 3 days ago. Gestational age by LMP of 9 weeks 2 days. EXAM: OBSTETRIC <14 WK [place] AND TRANSVAGINAL OB US TECHNIQUE: Both transabdominal and transvaginal ultrasound examinations were performed for complete evaluation of the gestation as well as the maternal uterus, adnexal regions, and pelvic cul-de-sac. Transvaginal technique was performed to assess early pregnancy. COMPARISON:  None. FINDINGS: Intrauterine gestational sac: Single Yolk sac:  Visualized. Embryo:  Visualized. Cardiac Activity: Visualized. Heart Rate: 123 bpm CRL:   7  mm   6 w 4 d                  [place] EDC: 04/20/2017 Subchorionic hemorrhage:  None visualized.  Maternal uterus/adnexae: Both ovaries are normal in appearance. No evidence of mass or abnormal free fluid. IMPRESSION: Single living IUP measuring 6 weeks 4 days, with [place] EDC of 04/20/2017. This approximately 3 weeks behind menstrual dates. No significant maternal uterine or adnexal abnormality identified. Electronically Signed   By: Myles Rosenthal M.D.   On: 08/29/2016 17:10   US Ob Transvaginal  Result Date: 08/29/2016 CLINICAL DATA:  Vaginal bleeding beginning 3 days ago. Gestational age by LMP of 9 weeks 2 days. EXAM: OBSTETRIC <14 WK [place] AND TRANSVAGINAL OB US TECHNIQUE: Both transabdominal and transvaginal ultrasound examinations were performed for complete evaluation of the gestation as well as the maternal uterus, adnexal regions, and pelvic cul-de-sac. Transvaginal technique was performed to assess early pregnancy. COMPARISON:  None. FINDINGS: Intrauterine gestational sac: Single Yolk sac:  Visualized. Embryo:  Visualized. Cardiac Activity: Visualized. Heart Rate: 123 bpm CRL:   7  mm   6 w 4 d                  [place] EDC: 04/20/2017 Subchorionic hemorrhage:  None visualized. Maternal uterus/adnexae: Both ovaries are normal in appearance. No evidence of mass or abnormal free fluid. IMPRESSION: Single living IUP measuring 6 weeks 4 days, with [place] EDC of 04/20/2017. This approximately 3 weeks behind menstrual dates. No  significant maternal uterine or adnexal abnormality identified. Electronically Signed   By: Myles Rosenthal M.D.   On: 08/29/2016 17:10   MAU Course  Procedures LR 1 L bolus Phenergan 12.5 mg IV Pepcid 20 mg IV Po challenge  MDM Labs and [place] ordered and reviewed. Nausea much improved. Tolerating po. No episodes of emesis. Presentation, clinical findings, and plan discussed with Dr. Senaida Ores. Stable for discharge home.  Assessment and Plan   1. Nausea/vomiting in pregnancy   2. Spotting affecting pregnancy   3. Cramping affecting pregnancy, antepartum   4. Dehydration   5. Normal  intrauterine pregnancy on prenatal ultrasound in first trimester   6.      Rh positive  Discharge home Maintain hydration Follow up in office in 1 week- call for appointment    Medication List    STOP taking these medications   doxycycline 100 MG capsule Commonly known as:  VIBRAMYCIN   ibuprofen 200 MG tablet Commonly known as:  ADVIL,MOTRIN   metroNIDAZOLE 500 MG tablet Commonly known as:  FLAGYL   naproxen 500 MG tablet Commonly known as:  NAPROSYN   ondansetron 4 MG disintegrating tablet Commonly known as:  ZOFRAN ODT   oxyCODONE-acetaminophen 5-325 MG tablet Commonly known as:  PERCOCET/ROXICET   penicillin v potassium 500 MG tablet Commonly known as:  VEETID     TAKE these medications   promethazine 12.5 MG tablet Commonly known as:  PHENERGAN Take 1 tablet (12.5 mg total) by mouth every 6 (six) hours as needed for nausea or vomiting.      Donette Larry, CNM 08/29/2016, 2:09 PM

## 2016-08-29 NOTE — MAU Note (Signed)
Pt reports she has been nauseated for about 5 days. Unable to eat much (not vomiting). Stated she can't stand for very ling because then she gets nauseated. Took pregnancy test last week and it was negative. LMP was end of September. Pt reports she has irregular periods so that is not unusual. Reports some abd pain with some diarrhea

## 2016-08-29 NOTE — Discharge Instructions (Signed)
Dehydration, Adult °Dehydration is when there is not enough fluid or water in your body. This happens when you lose more fluids than you take in. Dehydration can range from mild to very bad. It should be treated right away to keep it from getting very bad. °Symptoms of mild dehydration may include: °· Thirst. °· Dry lips. °· Slightly dry mouth. °· Dry, warm skin. °· Dizziness. °Symptoms of moderate dehydration may include: °· Very dry mouth. °· Muscle cramps. °· Dark pee (urine). Pee may be the color of tea. °· Your body making less pee. °· Your eyes making fewer tears. °· Heartbeat that is uneven or faster than normal (palpitations). °· Headache. °· Light-headedness, especially when you stand up from sitting. °· Fainting (syncope). °Symptoms of very bad dehydration may include: °· Changes in skin, such as: °¨ Cold and clammy skin. °¨ Blotchy (mottled) or pale skin. °¨ Skin that does not quickly return to normal after being lightly pinched and let go (poor skin turgor). °· Changes in body fluids, such as: °¨ Feeling very thirsty. °¨ Your eyes making fewer tears. °¨ Not sweating when body temperature is high, such as in hot weather. °¨ Your body making very little pee. °· Changes in vital signs, such as: °¨ Weak pulse. °¨ Pulse that is more than 100 beats a minute when you are sitting still. °¨ Fast breathing. °¨ Low blood pressure. °· Other changes, such as: °¨ Sunken eyes. °¨ Cold hands and feet. °¨ Confusion. °¨ Lack of energy (lethargy). °¨ Trouble waking up from sleep. °¨ Short-term weight loss. °¨ Unconsciousness. °Follow these instructions at home: °· If told by your doctor, drink an ORS: °¨ Make an ORS by using instructions on the package. °¨ Start by drinking small amounts, about ½ cup (120 mL) every 5-10 minutes. °¨ Slowly drink more until you have had the amount that your doctor said to have. °· Drink enough clear fluid to keep your pee clear or pale yellow. If you were told to drink an ORS, finish the ORS  first, then start slowly drinking clear fluids. Drink fluids such as: °¨ Water. Do not drink only water by itself. Doing that can make the salt (sodium) level in your body get too low (hyponatremia). °¨ Ice chips. °¨ Fruit juice that you have added water to (diluted). °¨ Low-calorie sports drinks. °· Avoid: °¨ Alcohol. °¨ Drinks that have a lot of sugar. These include high-calorie sports drinks, fruit juice that does not have water added, and soda. °¨ Caffeine. °¨ Foods that are greasy or have a lot of fat or sugar. °· Take over-the-counter and prescription medicines only as told by your doctor. °· Do not take salt tablets. Doing that can make the salt level in your body get too high (hypernatremia). °· Eat foods that have minerals (electrolytes). Examples include bananas, oranges, potatoes, tomatoes, and spinach. °· Keep all follow-up visits as told by your doctor. This is important. °Contact a doctor if: °· You have belly (abdominal) pain that: °¨ Gets worse. °¨ Stays in one area (localizes). °· You have a rash. °· You have a stiff neck. °· You get angry or annoyed more easily than normal (irritability). °· You are more sleepy than normal. °· You have a harder time waking up than normal. °· You feel: °¨ Weak. °¨ Dizzy. °¨ Very thirsty. °· You have peed (urinated) only a small amount of very dark pee during 6-8 hours. °Get help right away if: °· You have symptoms of   very bad dehydration.  You cannot drink fluids without throwing up (vomiting).  Your symptoms get worse with treatment.  You have a fever.  You have a very bad headache.  You are throwing up or having watery poop (diarrhea) and it:  Gets worse.  Does not go away.  You have blood or something green (bile) in your throw-up.  You have blood in your poop (stool). This may cause poop to look black and tarry.  You have not peed in 6-8 hours.  You pass out (faint).  Your heart rate when you are sitting still is more than 100 beats a  minute.  You have trouble breathing. This information is not intended to replace advice given to you by your health care provider. Make sure you discuss any questions you have with your health care provider. Document Released: 07/13/2009 Document Revised: 04/05/2016 Document Reviewed: 11/10/2015 Elsevier Interactive Patient Education  2017 [employer]  Morning Sickness Morning sickness is when you feel sick to your stomach (nauseous) during pregnancy. This nauseous feeling may or may not come with vomiting. It often occurs in the morning but can be a problem any time of day. Morning sickness is most common during the first trimester, but it may continue throughout pregnancy. While morning sickness is unpleasant, it is usually harmless unless you develop severe and continual vomiting (hyperemesis gravidarum). This condition requires more intense treatment. What are the causes? The cause of morning sickness is not completely known but seems to be related to normal hormonal changes that occur in pregnancy. What increases the risk? You are at greater risk if you:  Experienced nausea or vomiting before your pregnancy.  Had morning sickness during a previous pregnancy.  Are pregnant with more than one baby, such as twins. How is this treated? Do not use any medicines (prescription, over-the-counter, or herbal) for morning sickness without first talking to your health care provider. Your health care provider may prescribe or recommend:  Vitamin B6 supplements.  Anti-nausea medicines.  The herbal medicine ginger. Follow these instructions at home:  Only take over-the-counter or prescription medicines as directed by your health care provider.  Taking multivitamins before getting pregnant can prevent or decrease the severity of morning sickness in most women.  Eat a piece of dry toast or unsalted crackers before getting out of bed in the morning.  Eat five or six small meals a day.  Eat  dry and bland foods (rice, baked potato). Foods high in carbohydrates are often helpful.  Do not drink liquids with your meals. Drink liquids between meals.  Avoid greasy, fatty, and spicy foods.  Get someone to cook for you if the smell of any food causes nausea and vomiting.  If you feel nauseous after taking prenatal vitamins, take the vitamins at night or with a snack.  Snack on protein foods (nuts, yogurt, cheese) between meals if you are hungry.  Eat unsweetened gelatins for desserts.  Wearing an acupressure wristband (worn for sea sickness) may be helpful.  Acupuncture may be helpful.  Do not smoke.  Get a humidifier to keep the air in your house free of odors.  Get plenty of fresh air. Contact a health care provider if:  Your home remedies are not working, and you need medicine.  You feel dizzy or lightheaded.  You are losing weight. Get help right away if:  You have persistent and uncontrolled nausea and vomiting.  You pass out (faint). This information is not intended to replace advice given  to you by your health care provider. Make sure you discuss any questions you have with your health care provider. Document Released: 11/07/2006 Document Revised: 02/22/2016 Document Reviewed: 03/03/2013 Elsevier Interactive Patient Education  2017 [employer].

## 2016-08-30 LAB — GC/CHLAMYDIA PROBE AMP (~~LOC~~) NOT AT ARMC
CHLAMYDIA, DNA PROBE: NEGATIVE
NEISSERIA GONORRHEA: NEGATIVE

## 2016-09-12 ENCOUNTER — Encounter (HOSPITAL_COMMUNITY): Payer: Self-pay | Admitting: *Deleted

## 2016-09-12 ENCOUNTER — Inpatient Hospital Stay (HOSPITAL_COMMUNITY)
Admission: AD | Admit: 2016-09-12 | Discharge: 2016-09-12 | Disposition: A | Discharge status: Home / Self Care | Payer: Medicaid Other | Attending: Obstetrics and Gynecology | Admitting: Obstetrics and Gynecology

## 2016-09-12 DIAGNOSIS — O26851 Spotting complicating pregnancy, first trimester: Secondary | ICD-10-CM | POA: Diagnosis not present

## 2016-09-12 DIAGNOSIS — M545 Low back pain: Secondary | ICD-10-CM | POA: Insufficient documentation

## 2016-09-12 DIAGNOSIS — Z3A08 8 weeks gestation of pregnancy: Secondary | ICD-10-CM | POA: Diagnosis not present

## 2016-09-12 DIAGNOSIS — O26891 Other specified pregnancy related conditions, first trimester: Secondary | ICD-10-CM | POA: Insufficient documentation

## 2016-09-12 DIAGNOSIS — R102 Pelvic and perineal pain: Secondary | ICD-10-CM

## 2016-09-12 DIAGNOSIS — O209 Hemorrhage in early pregnancy, unspecified: Secondary | ICD-10-CM | POA: Diagnosis present

## 2016-09-12 LAB — URINALYSIS, ROUTINE W REFLEX MICROSCOPIC
Bilirubin Urine: NEGATIVE
Glucose, UA: 50 mg/dL — AB
HGB URINE DIPSTICK: NEGATIVE
Ketones, ur: NEGATIVE mg/dL
NITRITE: NEGATIVE
Protein, ur: 30 mg/dL — AB
SPECIFIC GRAVITY, URINE: 1.024 (ref 1.005–1.030)
pH: 6 (ref 5.0–8.0)

## 2016-09-12 NOTE — Discharge Instructions (Signed)
Vaginal Bleeding During Pregnancy, First Trimester °A small amount of bleeding (spotting) from the vagina is common in early pregnancy. Sometimes the bleeding is normal and is not a problem, and sometimes it is a sign of something serious. Be sure to tell your doctor about any bleeding from your vagina right away. °Follow these instructions at home: °· Watch your condition for any changes. °· Follow your doctor's instructions about how active you can be. °· If you are on bed rest: °¨ You may need to stay in bed and only get up to use the bathroom. °¨ You may be allowed to do some activities. °¨ If you need help, make plans for someone to help you. °· Write down: °¨ The number of pads you use each day. °¨ How often you change pads. °¨ How soaked (saturated) your pads are. °· Do not use tampons. °· Do not douche. °· Do not have sex or orgasms until your doctor says it is okay. °· If you pass any tissue from your vagina, save the tissue so you can show it to your doctor. °· Only take medicines as told by your doctor. °· Do not take aspirin because it can make you bleed. °· Keep all follow-up visits as told by your doctor. °Contact a doctor if: °· You bleed from your vagina. °· You have cramps. °· You have labor pains. °· You have a fever that does not go away after you take medicine. °Get help right away if: °· You have very bad cramps in your back or belly (abdomen). °· You pass large clots or tissue from your vagina. °· You bleed more. °· You feel light-headed or weak. °· You pass out (faint). °· You have chills. °· You are leaking fluid or have a gush of fluid from your vagina. °· You pass out while pooping (having a bowel movement). °This information is not intended to replace advice given to you by your health care provider. Make sure you discuss any questions you have with your health care provider. °Document Released: 01/31/2014 Document Revised: 02/22/2016 Document Reviewed: 05/24/2013 °Elsevier Interactive  Patient Education © 2017 [employer]. ° °

## 2016-09-12 NOTE — MAU Note (Signed)
Pt c/op vag bleeding on and off for 3 days. Not bleeding rright now. Also c/o some lower back pain.

## 2016-09-12 NOTE — MAU Provider Note (Signed)
Chief Complaint: Vaginal Bleeding   First Provider Initiated Contact with Patient 09/12/16 1528        SUBJECTIVE HPI: Patricia Villanueva is a 25 y.o. G2P1001 at 3318w4d by LMP who presents to maternity admissions reporting Vaginal bleeding for 3 days, spotting in nature.  Lower back is sore. She denies vaginal bleeding, vaginal itching/burning, urinary symptoms, h/a, dizziness, n/v, or fever/chills.   Vaginal Bleeding  The patient's primary symptoms include vaginal bleeding. The patient's pertinent negatives include no genital itching, genital lesions, genital odor, missed menses, pelvic pain or vaginal discharge. This is a recurrent problem. The current episode started in the past 7 days. The problem occurs intermittently. The problem has been resolved. The pain is mild. She is pregnant. Associated symptoms include back pain. Pertinent negatives include no abdominal pain, constipation, diarrhea, fever, headaches, nausea or vomiting. The vaginal discharge was bloody. The vaginal bleeding is spotting. She has not been passing clots. She has not been passing tissue. Nothing aggravates the symptoms. She has tried nothing for the symptoms.    RN Note: Pt c/op vag bleeding on and off for 3 days. Not bleeding rright now. Also c/o some lower back pain  Past Medical History:  Diagnosis Date  . Medical history non-contributory    Past Surgical History:  Procedure Laterality Date  . arm surgery     left arm surgery to repair a nerve  . arm surgery    . DENTAL SURGERY    . NERVE EXPLORATION  04/04/2012   Procedure: NERVE EXPLORATION;  Surgeon: Sharma CovertFred W Ortmann, MD;  [facility];  Service: Orthopedics;  Laterality: Left;  Left Arm Laceration with Nerve Repair   Social History   Social History  . Marital status: Single    Spouse name: N/A  . Number of children: N/A  . Years of education: N/A   Occupational History  . Not on file.   Social History Main Topics  . Smoking status: Never Smoker  .  Smokeless tobacco: Never Used  . Alcohol use Yes  . Drug use: No  . Sexual activity: Yes    Birth control/ protection: None   Other Topics Concern  . Not on file   Social History Narrative  . No narrative on file   No current facility-administered medications on file prior to encounter.    Current Outpatient Prescriptions on File Prior to Encounter  Medication Sig Dispense Refill  . promethazine (PHENERGAN) 12.5 MG tablet Take 1 tablet (12.5 mg total) by mouth every 6 (six) hours as needed for nausea or vomiting. 30 tablet 0   No Known Allergies  I have reviewed patient's Past Medical Hx, Surgical Hx, Family Hx, Social Hx, medications and allergies.   ROS:  Review of Systems  Constitutional: Negative for fever.  Gastrointestinal: Negative for abdominal pain, constipation, diarrhea, nausea and vomiting.  Genitourinary: Positive for vaginal bleeding. Negative for missed menses, pelvic pain and vaginal discharge.  Musculoskeletal: Positive for back pain.  Neurological: Negative for headaches.   Review of Systems  Other systems negative   Physical Exam  Physical Exam Patient Vitals for the past 24 hrs:  BP Temp Temp src Resp Height Weight  09/12/16 1324 123/78 98.4 F (36.9 C) Oral (!) 64 5\' 5"  (1.651 m) 201 lb 1.6 oz (91.2 kg)   Constitutional: Well-developed, well-nourished female in no acute distress.  Cardiovascular: normal rate Respiratory: normal effort GI: Abd soft, non-tender. Pos BS x 4 MS: Extremities nontender, no edema, normal ROM Neurologic:  Alert and oriented x 4.  GU: Neg CVAT.  PELVIC EXAM: Cervix pink, visually closed, without lesion, scant white creamy discharge, vaginal walls and external genitalia normal   NO BLOOD SEEN Bimanual exam: Cervix 0/long/high, firm, anterior, neg CMT, uterus nontender, nonenlarged, adnexa without tenderness, enlargement, or mass  FHT 160 by bedside US  LAB RESULTS Results for orders placed or performed during the  hospital encounter of 09/12/16 (from the past 72 hour(s))  Urinalysis, Routine w reflex microscopic     Status: Abnormal   Collection Time: 09/12/16  2:34 PM  Result Value Ref Range   Color, Urine YELLOW YELLOW   APPearance CLOUDY (A) CLEAR   Specific Gravity, Urine 1.024 1.005 - 1.030   pH 6.0 5.0 - 8.0   Glucose, UA 50 (A) NEGATIVE mg/dL   Hgb urine dipstick NEGATIVE NEGATIVE   Bilirubin Urine NEGATIVE NEGATIVE   Ketones, ur NEGATIVE NEGATIVE mg/dL   Protein, ur 30 (A) NEGATIVE mg/dL   Nitrite NEGATIVE NEGATIVE   Leukocytes, UA MODERATE (A) NEGATIVE   RBC / HPF 0-5 0 - 5 RBC/hpf   WBC, UA 6-30 0 - 5 WBC/hpf   Bacteria, UA MANY (A) NONE SEEN   Squamous Epithelial / LPF 6-30 (A) NONE SEEN   Mucous PRESENT        IMAGING [place] Ob Comp Less 14 Wks  Result Date: 08/29/2016 CLINICAL DATA:  Vaginal bleeding beginning 3 days ago. Gestational age by LMP of 9 weeks 2 days. EXAM: OBSTETRIC <14 WK US AND TRANSVAGINAL OB US TECHNIQUE: Both transabdominal and transvaginal ultrasound examinations were performed for complete evaluation of the gestation as well as the maternal uterus, adnexal regions, and pelvic cul-de-sac. Transvaginal technique was performed to assess early pregnancy. COMPARISON:  None. FINDINGS: Intrauterine gestational sac: Single Yolk sac:  Visualized. Embryo:  Visualized. Cardiac Activity: Visualized. Heart Rate: 123 bpm CRL:   7  mm   6 w 4 d                  US EDC: 04/20/2017 Subchorionic hemorrhage:  None visualized. Maternal uterus/adnexae: Both ovaries are normal in appearance. No evidence of mass or abnormal free fluid. IMPRESSION: Single living IUP measuring 6 weeks 4 days, with US EDC of 04/20/2017. This approximately 3 weeks behind menstrual dates. No significant maternal uterine or adnexal abnormality identified. Electronically Signed   By: Myles RosenthalJohn  Stahl M.D.   On: 08/29/2016 17:10   [place] Ob Transvaginal  Result Date: 08/29/2016 CLINICAL DATA:  Vaginal bleeding beginning  3 days ago. Gestational age by LMP of 9 weeks 2 days. EXAM: OBSTETRIC <14 WK US AND TRANSVAGINAL OB US TECHNIQUE: Both transabdominal and transvaginal ultrasound examinations were performed for complete evaluation of the gestation as well as the maternal uterus, adnexal regions, and pelvic cul-de-sac. Transvaginal technique was performed to assess early pregnancy. COMPARISON:  None. FINDINGS: Intrauterine gestational sac: Single Yolk sac:  Visualized. Embryo:  Visualized. Cardiac Activity: Visualized. Heart Rate: 123 bpm CRL:   7  mm   6 w 4 d                  US EDC: 04/20/2017 Subchorionic hemorrhage:  None visualized. Maternal uterus/adnexae: Both ovaries are normal in appearance. No evidence of mass or abnormal free fluid. IMPRESSION: Single living IUP measuring 6 weeks 4 days, with US EDC of 04/20/2017. This approximately 3 weeks behind menstrual dates. No significant maternal uterine or adnexal abnormality identified. Electronically Signed   By: Alver SorrowJohn  Stahl M.D.  On: 08/29/2016 17:10    MAU Management/MDM: Reassured fetus is well.  Bleeding may be implantation bleeding Cervix closed and fetus with good heartbeat is reassuring    ASSESSMENT SIUP at [redacted]w[redacted]d Spotting in early pregnancy, resolved Low back pain, unexplained, nonsevere  PLAN Discharge home Followup in office for new OB appointment Pelvic rest Bleeding precautions  Pt stable at time of discharge. Encouraged to return here or to other Urgent Care/ED if she develops worsening of symptoms, increase in pain, fever, or other concerning symptoms.    Wynelle Bourgeois CNM, MSN Certified Nurse-Midwife 09/12/2016  3:29 PM

## 2016-09-30 NOTE — L&D Delivery Note (Signed)
Delivery Note At 1:22 PM a viable female was delivered via Vaginal, Spontaneous Delivery (Presentation: vtx; LOA ).  APGAR: 9, 9; weight pending.   Placenta status: spontaneous, intact.  Cord:  with the following complications: nuchal x 1 reduced.   Anesthesia:  Epidural Episiotomy: None Lacerations: None Suture Repair: none Est. Blood Loss (mL): 400  Mom to postpartum.  Baby to Couplet care / Skin to Skin.  Leighton Roachodd D Janelis Stelzer 04/15/2017, 1:39 PM

## 2016-10-11 DIAGNOSIS — Z368A Encounter for antenatal screening for other genetic defects: Secondary | ICD-10-CM | POA: Diagnosis not present

## 2016-10-21 ENCOUNTER — Encounter: Payer: Medicaid Other | Admitting: Certified Nurse Midwife

## 2016-10-24 LAB — OB RESULTS CONSOLE ABO/RH: RH TYPE: POSITIVE

## 2016-10-24 LAB — OB RESULTS CONSOLE ANTIBODY SCREEN: ANTIBODY SCREEN: NEGATIVE

## 2016-10-24 LAB — OB RESULTS CONSOLE RUBELLA ANTIBODY, IGM: RUBELLA: NON-IMMUNE/NOT IMMUNE

## 2016-10-24 LAB — OB RESULTS CONSOLE HEPATITIS B SURFACE ANTIGEN: HEP B S AG: NEGATIVE

## 2016-10-24 LAB — OB RESULTS CONSOLE HIV ANTIBODY (ROUTINE TESTING): HIV: NONREACTIVE

## 2017-03-21 LAB — OB RESULTS CONSOLE GBS: GBS: POSITIVE

## 2017-03-24 LAB — OB RESULTS CONSOLE GC/CHLAMYDIA
CHLAMYDIA, DNA PROBE: NEGATIVE
Gonorrhea: NEGATIVE

## 2017-04-08 ENCOUNTER — Telehealth (HOSPITAL_COMMUNITY): Payer: Self-pay | Admitting: *Deleted

## 2017-04-08 ENCOUNTER — Encounter (HOSPITAL_COMMUNITY): Payer: Self-pay | Admitting: *Deleted

## 2017-04-08 NOTE — Telephone Encounter (Signed)
Preadmission screen  

## 2017-04-15 ENCOUNTER — Inpatient Hospital Stay (HOSPITAL_COMMUNITY): Payer: Medicaid Other | Admitting: Anesthesiology

## 2017-04-15 ENCOUNTER — Inpatient Hospital Stay (HOSPITAL_COMMUNITY)
Admission: RE | Admit: 2017-04-15 | Discharge: 2017-04-16 | DRG: 775 | Disposition: A | Discharge status: Home / Self Care | Payer: Medicaid Other | Source: Ambulatory Visit | Attending: Obstetrics and Gynecology | Admitting: Obstetrics and Gynecology

## 2017-04-15 ENCOUNTER — Encounter (HOSPITAL_COMMUNITY): Payer: Self-pay

## 2017-04-15 DIAGNOSIS — O99824 Streptococcus B carrier state complicating childbirth: Secondary | ICD-10-CM | POA: Diagnosis present

## 2017-04-15 DIAGNOSIS — Z3A39 39 weeks gestation of pregnancy: Secondary | ICD-10-CM | POA: Diagnosis not present

## 2017-04-15 DIAGNOSIS — Z3493 Encounter for supervision of normal pregnancy, unspecified, third trimester: Secondary | ICD-10-CM

## 2017-04-15 DIAGNOSIS — O26893 Other specified pregnancy related conditions, third trimester: Secondary | ICD-10-CM | POA: Diagnosis present

## 2017-04-15 LAB — CBC
HEMATOCRIT: 32.5 % — AB (ref 36.0–46.0)
HEMOGLOBIN: 10.6 g/dL — AB (ref 12.0–15.0)
MCH: 26 pg (ref 26.0–34.0)
MCHC: 32.6 g/dL (ref 30.0–36.0)
MCV: 79.7 fL (ref 78.0–100.0)
Platelets: 145 10*3/uL — ABNORMAL LOW (ref 150–400)
RBC: 4.08 MIL/uL (ref 3.87–5.11)
RDW: 13.3 % (ref 11.5–15.5)
WBC: 8.4 10*3/uL (ref 4.0–10.5)

## 2017-04-15 LAB — TYPE AND SCREEN
ABO/RH(D): A POS
Antibody Screen: NEGATIVE

## 2017-04-15 MED ORDER — ACETAMINOPHEN 325 MG PO TABS
650.0000 mg | ORAL_TABLET | ORAL | Status: DC | PRN
  Administered 2017-04-15 – 2017-04-16 (×2): 650 mg via ORAL
  Filled 2017-04-15 (×2): qty 2

## 2017-04-15 MED ORDER — ZOLPIDEM TARTRATE 5 MG PO TABS: 5.0000 mg | ORAL_TABLET | Freq: Every evening | ORAL | Status: DC | PRN

## 2017-04-15 MED ORDER — PHENYLEPHRINE 40 MCG/ML (10ML) SYRINGE FOR IV PUSH (FOR BLOOD PRESSURE SUPPORT): 80.0000 ug | PREFILLED_SYRINGE | INTRAVENOUS | Status: DC | PRN

## 2017-04-15 MED ORDER — LACTATED RINGERS IV SOLN
500.0000 mL | Freq: Once | INTRAVENOUS | Status: AC
  Administered 2017-04-15: 500 mL via INTRAVENOUS

## 2017-04-15 MED ORDER — FENTANYL 2.5 MCG/ML BUPIVACAINE 1/10 % EPIDURAL INFUSION (WH - ANES)
14.0000 mL/h | INTRAMUSCULAR | Status: DC | PRN
  Administered 2017-04-15 (×2): 14 mL/h via EPIDURAL
  Filled 2017-04-15: qty 100

## 2017-04-15 MED ORDER — SIMETHICONE 80 MG PO CHEW: 80.0000 mg | CHEWABLE_TABLET | ORAL | Status: DC | PRN

## 2017-04-15 MED ORDER — DIPHENHYDRAMINE HCL 50 MG/ML IJ SOLN: 12.5000 mg | INTRAMUSCULAR | Status: DC | PRN

## 2017-04-15 MED ORDER — LACTATED RINGERS IV SOLN: 500.0000 mL | Freq: Once | INTRAVENOUS | Status: DC

## 2017-04-15 MED ORDER — METHYLERGONOVINE MALEATE 0.2 MG PO TABS: 0.2000 mg | ORAL_TABLET | ORAL | Status: DC | PRN

## 2017-04-15 MED ORDER — DIBUCAINE 1 % RE OINT: 1.0000 "application " | TOPICAL_OINTMENT | RECTAL | Status: DC | PRN

## 2017-04-15 MED ORDER — ONDANSETRON HCL 4 MG/2ML IJ SOLN: 4.0000 mg | INTRAMUSCULAR | Status: DC | PRN

## 2017-04-15 MED ORDER — PHENYLEPHRINE 40 MCG/ML (10ML) SYRINGE FOR IV PUSH (FOR BLOOD PRESSURE SUPPORT)
80.0000 ug | PREFILLED_SYRINGE | INTRAVENOUS | Status: DC | PRN
  Filled 2017-04-15: qty 5
  Filled 2017-04-15: qty 10

## 2017-04-15 MED ORDER — BENZOCAINE-MENTHOL 20-0.5 % EX AERO: 1.0000 "application " | INHALATION_SPRAY | CUTANEOUS | Status: DC | PRN

## 2017-04-15 MED ORDER — OXYCODONE-ACETAMINOPHEN 5-325 MG PO TABS: 2.0000 | ORAL_TABLET | ORAL | Status: DC | PRN

## 2017-04-15 MED ORDER — WITCH HAZEL-GLYCERIN EX PADS: 1.0000 "application " | MEDICATED_PAD | CUTANEOUS | Status: DC | PRN

## 2017-04-15 MED ORDER — LIDOCAINE HCL (PF) 1 % IJ SOLN
30.0000 mL | INTRAMUSCULAR | Status: DC | PRN
  Filled 2017-04-15: qty 30

## 2017-04-15 MED ORDER — METHYLERGONOVINE MALEATE 0.2 MG PO TABS
0.2000 mg | ORAL_TABLET | Freq: Four times a day (QID) | ORAL | Status: AC
  Administered 2017-04-15 – 2017-04-16 (×4): 0.2 mg via ORAL
  Filled 2017-04-15 (×4): qty 1

## 2017-04-15 MED ORDER — EPHEDRINE 5 MG/ML INJ: 10.0000 mg | INTRAVENOUS | Status: DC | PRN

## 2017-04-15 MED ORDER — OXYCODONE HCL 5 MG PO TABS
5.0000 mg | ORAL_TABLET | ORAL | Status: DC | PRN
  Administered 2017-04-16 (×2): 5 mg via ORAL
  Filled 2017-04-15 (×2): qty 1

## 2017-04-15 MED ORDER — BUTORPHANOL TARTRATE 1 MG/ML IJ SOLN: 1.0000 mg | INTRAMUSCULAR | Status: DC | PRN

## 2017-04-15 MED ORDER — EPHEDRINE 5 MG/ML INJ
10.0000 mg | INTRAVENOUS | Status: DC | PRN
  Filled 2017-04-15: qty 2

## 2017-04-15 MED ORDER — IBUPROFEN 600 MG PO TABS
600.0000 mg | ORAL_TABLET | Freq: Four times a day (QID) | ORAL | Status: DC
  Administered 2017-04-15 – 2017-04-16 (×4): 600 mg via ORAL
  Filled 2017-04-15 (×4): qty 1

## 2017-04-15 MED ORDER — PENICILLIN G POTASSIUM 5000000 UNITS IJ SOLR
5.0000 10*6.[IU] | Freq: Once | INTRAVENOUS | Status: AC
  Administered 2017-04-15: 5 10*6.[IU] via INTRAVENOUS
  Filled 2017-04-15: qty 5

## 2017-04-15 MED ORDER — PRENATAL MULTIVITAMIN CH
1.0000 | ORAL_TABLET | Freq: Every day | ORAL | Status: DC
  Administered 2017-04-16: 1 via ORAL
  Filled 2017-04-15: qty 1

## 2017-04-15 MED ORDER — ONDANSETRON HCL 4 MG PO TABS: 4.0000 mg | ORAL_TABLET | ORAL | Status: DC | PRN

## 2017-04-15 MED ORDER — OXYTOCIN 40 UNITS IN LACTATED RINGERS INFUSION - SIMPLE MED: 2.5000 [IU]/h | INTRAVENOUS | Status: DC

## 2017-04-15 MED ORDER — DIPHENHYDRAMINE HCL 25 MG PO CAPS: 25.0000 mg | ORAL_CAPSULE | Freq: Four times a day (QID) | ORAL | Status: DC | PRN

## 2017-04-15 MED ORDER — SOD CITRATE-CITRIC ACID 500-334 MG/5ML PO SOLN: 30.0000 mL | ORAL | Status: DC | PRN

## 2017-04-15 MED ORDER — OXYCODONE-ACETAMINOPHEN 5-325 MG PO TABS: 1.0000 | ORAL_TABLET | ORAL | Status: DC | PRN

## 2017-04-15 MED ORDER — PENICILLIN G POT IN DEXTROSE 60000 UNIT/ML IV SOLN
3.0000 10*6.[IU] | INTRAVENOUS | Status: DC
  Administered 2017-04-15: 3 10*6.[IU] via INTRAVENOUS
  Filled 2017-04-15 (×4): qty 50

## 2017-04-15 MED ORDER — OXYCODONE HCL 5 MG PO TABS: 10.0000 mg | ORAL_TABLET | ORAL | Status: DC | PRN

## 2017-04-15 MED ORDER — TERBUTALINE SULFATE 1 MG/ML IJ SOLN
0.2500 mg | Freq: Once | INTRAMUSCULAR | Status: DC | PRN
  Filled 2017-04-15: qty 1

## 2017-04-15 MED ORDER — OXYTOCIN BOLUS FROM INFUSION
500.0000 mL | Freq: Once | INTRAVENOUS | Status: AC
  Administered 2017-04-15: 500 mL via INTRAVENOUS

## 2017-04-15 MED ORDER — PHENYLEPHRINE 40 MCG/ML (10ML) SYRINGE FOR IV PUSH (FOR BLOOD PRESSURE SUPPORT)
80.0000 ug | PREFILLED_SYRINGE | INTRAVENOUS | Status: DC | PRN
  Filled 2017-04-15: qty 5

## 2017-04-15 MED ORDER — ONDANSETRON HCL 4 MG/2ML IJ SOLN: 4.0000 mg | Freq: Four times a day (QID) | INTRAMUSCULAR | Status: DC | PRN

## 2017-04-15 MED ORDER — TETANUS-DIPHTH-ACELL PERTUSSIS 5-2.5-18.5 LF-MCG/0.5 IM SUSP: 0.5000 mL | Freq: Once | INTRAMUSCULAR | Status: DC

## 2017-04-15 MED ORDER — SENNOSIDES-DOCUSATE SODIUM 8.6-50 MG PO TABS
2.0000 | ORAL_TABLET | ORAL | Status: DC
  Administered 2017-04-15: 2 via ORAL
  Filled 2017-04-15: qty 2

## 2017-04-15 MED ORDER — ACETAMINOPHEN 325 MG PO TABS: 650.0000 mg | ORAL_TABLET | ORAL | Status: DC | PRN

## 2017-04-15 MED ORDER — MAGNESIUM HYDROXIDE 400 MG/5ML PO SUSP: 30.0000 mL | ORAL | Status: DC | PRN

## 2017-04-15 MED ORDER — METHYLERGONOVINE MALEATE 0.2 MG/ML IJ SOLN: 0.2000 mg | INTRAMUSCULAR | Status: DC | PRN

## 2017-04-15 MED ORDER — LACTATED RINGERS IV SOLN
INTRAVENOUS | Status: DC
  Administered 2017-04-15: 07:00:00 via INTRAVENOUS

## 2017-04-15 MED ORDER — MEASLES, MUMPS & RUBELLA VAC ~~LOC~~ INJ
0.5000 mL | INJECTION | Freq: Once | SUBCUTANEOUS | Status: DC
  Filled 2017-04-15: qty 0.5

## 2017-04-15 MED ORDER — LIDOCAINE HCL (PF) 1 % IJ SOLN
INTRAMUSCULAR | Status: DC | PRN
  Administered 2017-04-15 (×2): 5 mL via EPIDURAL

## 2017-04-15 MED ORDER — OXYTOCIN 40 UNITS IN LACTATED RINGERS INFUSION - SIMPLE MED
1.0000 m[IU]/min | INTRAVENOUS | Status: DC
  Administered 2017-04-15: 2 m[IU]/min via INTRAVENOUS
  Filled 2017-04-15: qty 1000

## 2017-04-15 MED ORDER — LACTATED RINGERS IV SOLN: 500.0000 mL | INTRAVENOUS | Status: DC | PRN

## 2017-04-15 MED ORDER — COCONUT OIL OIL: 1.0000 "application " | TOPICAL_OIL | Status: DC | PRN

## 2017-04-15 NOTE — H&P (Signed)
Patricia Villanueva is a 26 y.o. female, G2 P1001, EGA [redacted] weeks with EDC 7-22 presenting for elective induction with favorable cervix.  Prenatal care complicated only by short nasal bone on anatomy u/s, no other markers, normal second trimester screen, declined any other testing.  OB History    Gravida Para Term Preterm AB Living   2 1 1  0 0 1   SAB TAB Ectopic Multiple Live Births   0 0 0 0 1     Past Medical History:  Diagnosis Date  . Medical history non-contributory    Past Surgical History:  Procedure Laterality Date  . arm surgery     left arm surgery to repair a nerve  . arm surgery    . DENTAL SURGERY    . NERVE EXPLORATION  04/04/2012   Procedure: NERVE EXPLORATION;  Surgeon: Sharma CovertFred W Ortmann, MD;  [facility];  Service: Orthopedics;  Laterality: Left;  Left Arm Laceration with Nerve Repair   Family History: family history includes Hypertension in her mother. Social History:  reports that she has never smoked. She has never used smokeless tobacco. She reports that she drinks alcohol. She reports that she does not use drugs.     Maternal Diabetes: No Genetic Screening: Normal Maternal Ultrasounds/Referrals: Abnormal:  Findings:   Other: Fetal Ultrasounds or other Referrals:  None Maternal Substance Abuse:  No Significant Maternal Medications:  None Significant Maternal Lab Results:  Lab values include: Group B Strep positive Other Comments:  short nasal bone on u/s  Review of Systems  Respiratory: Negative.   Cardiovascular: Negative.    Maternal Medical History:  Contractions: Frequency: irregular.   Perceived severity is mild.    Fetal activity: Perceived fetal activity is normal.    Prenatal complications: no prenatal complications Prenatal Complications - Diabetes: none.    Dilation: 5 Effacement (%): 70 Station: -2 Exam by:: Earlene Plateravis, RN  Blood pressure 129/79, pulse 65, temperature 98.1 F (36.7 C), temperature source Oral, resp. rate 18, height 5\' 5"   (1.651 m), weight 93.4 kg (206 lb), last menstrual period 06/25/2016, unknown if currently breastfeeding. Maternal Exam:  Uterine Assessment: Contraction strength is mild.  Contraction frequency is irregular.   Abdomen: Patient reports no abdominal tenderness. Estimated fetal weight is 7 lbs.   Fetal presentation: vertex  Introitus: Normal vulva. Normal vagina.  Amniotic fluid character: not assessed.  Pelvis: adequate for delivery.   Cervix: Cervix evaluated by digital exam.     Fetal Exam Fetal Monitor Review: Mode: ultrasound.   Baseline rate: 130-140.  Variability: moderate (6-25 bpm).   Pattern: accelerations present and no decelerations.    Fetal State Assessment: Category I - tracings are normal.     Physical Exam  Vitals reviewed. Constitutional: She appears well-developed and well-nourished.  Cardiovascular: Normal rate and regular rhythm.   Respiratory: Effort normal. No respiratory distress.  GI: Soft.    Prenatal labs: ABO, Rh: A/Positive/-- (01/25 0000) Antibody: Negative (01/25 0000) Rubella: Nonimmune (01/25 0000) RPR:   NR HBsAg: Negative (01/25 0000)  HIV: Non-reactive (01/25 0000)  GBS: Positive (06/22 0000)   Assessment/Plan: IUP at 39 weeks for induction, +GBS.  On pitocin, on PCN, monitor progress, anticipate SVD.   Leighton Roachodd D Tarron Krolak 04/15/2017, 8:20 AM

## 2017-04-15 NOTE — Progress Notes (Signed)
Patient ID: Patricia Villanueva, female   DOB: [birth year], 26 y.o.   MRN: [id number] Comfortable with epidural Afeb, VSS FHT- 130s, Cat I, ctx q 2-3 min VE-6/80/-1, vtx, AROM clear Continue pitocin and PCN, anticipate SVD

## 2017-04-15 NOTE — Anesthesia Pain Management Evaluation Note (Signed)
  CRNA Pain Management Visit Note  Patient: Patricia Villanueva, 26 y.o., female  "Hello I am a member of the anesthesia team at [facility]. We have an anesthesia team available at all times to provide care throughout the hospital, including epidural management and anesthesia for C-section. I don't know your plan for the delivery whether it a natural birth, water birth, IV sedation, nitrous supplementation, doula or epidural, but we want to meet your pain goals."   1.Was your pain managed to your expectations on prior hospitalizations?   Yes   2.What is your expectation for pain management during this hospitalization?     Epidural  3.How can we help you reach that goal? Told RN patient is requesting epidural now.  Record the patient's initial score and the patient's pain goal.   Pain: 8  Pain Goal: 5 [facility] wants you to be able to say your pain was always managed very well.  Patricia Villanueva 04/15/2017

## 2017-04-15 NOTE — Anesthesia Preprocedure Evaluation (Signed)
Anesthesia Evaluation  Patient identified by MRN, date of birth, ID band Patient awake    Reviewed: Allergy & Precautions, H&P , NPO status , Patient's Chart, lab work & pertinent test results  Airway Mallampati: II   Neck ROM: full    Dental   Pulmonary neg pulmonary ROS,    breath sounds clear to auscultation       Cardiovascular negative cardio ROS   Rhythm:regular Rate:Normal     Neuro/Psych    GI/Hepatic   Endo/Other  obese  Renal/GU      Musculoskeletal   Abdominal   Peds  Hematology   Anesthesia Other Findings   Reproductive/Obstetrics (+) Pregnancy                             Anesthesia Physical Anesthesia Plan  ASA: I  Anesthesia Plan: Epidural   Post-op Pain Management:    Induction: Intravenous  PONV Risk Score and Plan: 2 and Treatment may vary due to age or medical condition  Airway Management Planned: Natural Airway  Additional Equipment:   Intra-op Plan:   Post-operative Plan:   Informed Consent: I have reviewed the patients History and Physical, chart, labs and discussed the procedure including the risks, benefits and alternatives for the proposed anesthesia with the patient or authorized representative who has indicated his/her understanding and acceptance.     Plan Discussed with: Anesthesiologist  Anesthesia Plan Comments:         Anesthesia Quick Evaluation  

## 2017-04-15 NOTE — Anesthesia Postprocedure Evaluation (Signed)
Anesthesia Post Note  Patient: Patricia Villanueva  Procedure(s) Performed: * No procedures listed *     Patient location during evaluation: Mother Baby Anesthesia Type: Epidural Level of consciousness: awake, awake and alert and oriented Pain management: pain level controlled Vital Signs Assessment: post-procedure vital signs reviewed and stable Respiratory status: spontaneous breathing and nonlabored ventilation Cardiovascular status: stable Postop Assessment: no headache, no backache, epidural receding, patient able to bend at knees, no signs of nausea or vomiting and adequate PO intake Anesthetic complications: no    Last Vitals:  Vitals:   04/15/17 1515 04/15/17 1600  BP:  139/77  Pulse:  62  Resp: 18 18  Temp:  36.8 C    Last Pain:  Vitals:   04/15/17 1600  TempSrc: Oral  PainSc: 8    Pain Goal:                 Patricia Villanueva,Patricia Villanueva

## 2017-04-15 NOTE — Anesthesia Procedure Notes (Signed)
Epidural Patient location during procedure: OB Start time: 04/15/2017 10:33 AM End time: 04/15/2017 10:45 AM  Staffing Anesthesiologist: Chaney MallingHODIERNE, Jet Traynham Performed: anesthesiologist   Preanesthetic Checklist Completed: patient identified, site marked, pre-op evaluation, timeout performed, IV checked, risks and benefits discussed and monitors and equipment checked  Epidural Patient position: sitting Prep: DuraPrep Patient monitoring: heart rate, cardiac monitor, continuous pulse ox and blood pressure Approach: midline Location: L2-L3 Injection technique: LOR saline  Needle:  Needle type: Tuohy  Needle gauge: 17 G Needle length: 9 cm Needle insertion depth: 5 cm Catheter type: closed end flexible Catheter size: 19 Gauge Catheter at skin depth: 11 cm Test dose: negative and Other  Assessment Events: blood not aspirated, injection not painful, no injection resistance and negative IV test  Additional Notes Informed consent obtained prior to proceeding including risk of failure, 1% risk of PDPH, risk of minor discomfort and bruising.  Discussed rare but serious complications including epidural abscess, permanent nerve injury, epidural hematoma.  Discussed alternatives to epidural analgesia and patient desires to proceed.  Timeout performed pre-procedure verifying patient name, procedure, and platelet count.  Patient tolerated procedure well. Reason for block:procedure for pain

## 2017-04-16 LAB — RPR: RPR Ser Ql: NONREACTIVE

## 2017-04-16 LAB — BIRTH TISSUE RECOVERY COLLECTION (PLACENTA DONATION)

## 2017-04-16 MED ORDER — IBUPROFEN 600 MG PO TABS: 600.0000 mg | ORAL_TABLET | Freq: Four times a day (QID) | ORAL | 0 refills | Status: DC

## 2017-04-16 NOTE — Progress Notes (Signed)
Post Partum Day 1 Subjective: no complaints, up ad lib and tolerating PO  Pt requesting early d/c  Objective: Blood pressure (!) 128/59, pulse (!) 59, temperature 97.6 F (36.4 C), temperature source Oral, resp. rate 18, height 5\' 5"  (1.651 m), weight 93.4 kg (206 lb), last menstrual period 06/25/2016, SpO2 99 %, unknown if currently breastfeeding.  Physical Exam:  General: alert and cooperative Lochia: appropriate Uterine Fundus: firm    Recent Labs  04/15/17 0727  HGB 10.6*  HCT 32.5*    Assessment/Plan: Discharge home  If baby able to go   LOS: 1 day   Oliver PilaKathy W Allante Beane 04/16/2017, 9:52 AM

## 2017-04-16 NOTE — Discharge Summary (Signed)
OB Discharge Summary     Patient Name: Patricia Villanueva DOB: May 12, 1991 MRN: [id number]  Date of admission: 04/15/2017 Delivering MD: Jackelyn KnifeMEISINGER, TODD   Date of discharge: 04/16/2017  Admitting diagnosis: 39WKS INDUCTION Intrauterine pregnancy: 615w2d     Secondary diagnosis:  Active Problems:   Normal pregnancy in third trimester  Additional problems: none     Discharge diagnosis: Term Pregnancy Delivered                                                                                                Post partum procedures: none  Augmentation: AROM and Pitocin  Complications: None  Hospital course:  Induction of Labor With Vaginal Delivery   26 y.o. yo G2P2002 at [redacted]w[redacted]d was admitted to the hospital 04/15/2017 for induction of labor.  Indication for induction: Favorable cervix at term.  Patient had an uncomplicated labor course as follows: Membrane Rupture Time/Date: 12:20 PM ,04/15/2017   Intrapartum Procedures: Episiotomy: None [1]                                         Lacerations:  None [1]  Patient had delivery of a Viable infant.  Information for the patient's newborn:  Deloria Laireal, Girl Jenessa [[id number]][[id number]]  Delivery Method: Vaginal, Spontaneous Delivery (Filed from Delivery Summary)   04/15/2017  Details of delivery can be found in separate delivery note.  Patient had a routine postpartum course. Patient is discharged home 04/16/17.  Physical exam  Vitals:   04/15/17 1600 04/15/17 1700 04/15/17 2109 04/16/17 0404  BP: 139/77 125/65 129/72 (!) 128/59  Pulse: 62 75 63 (!) 59  Resp: 18 20 18 18   Temp: 98.2 F (36.8 C) 98.7 F (37.1 C) 98.1 F (36.7 C) 97.6 F (36.4 C)  TempSrc: Oral Oral Oral Oral  SpO2:    99%  Weight:      Height:       General: alert and cooperative Lochia: appropriate Uterine Fundus: firm  Labs: Lab Results  Component Value Date   WBC 8.4 04/15/2017   HGB 10.6 (L) 04/15/2017   HCT 32.5 (L) 04/15/2017   MCV 79.7 04/15/2017   PLT 145 (L)  04/15/2017   CMP Latest Ref Rng & Units 08/29/2016  Glucose 65 - 99 mg/dL 90  BUN 6 - 20 mg/dL 8  Creatinine 7.820.44 - 9.561.00 mg/dL 2.130.63  Sodium 086135 - 578145 mmol/L 136  Potassium 3.5 - 5.1 mmol/L 4.2  Chloride 101 - 111 mmol/L 105  CO2 22 - 32 mmol/L 24  Calcium 8.9 - 10.3 mg/dL 9.4  Total Protein 6.5 - 8.1 g/dL 7.1  Total Bilirubin 0.3 - 1.2 mg/dL 0.4  Alkaline Phos 38 - 126 U/L 54  AST 15 - 41 U/L 15  ALT 14 - 54 U/L 12(L)    Discharge instruction: per After Visit Summary and "Baby and Me Booklet".  After visit meds:  Allergies as of 04/16/2017   No Known Allergies     Medication List    TAKE  these medications   ibuprofen 600 MG tablet Commonly known as:  ADVIL,MOTRIN Take 1 tablet (600 mg total) by mouth every 6 (six) hours.   prenatal multivitamin Tabs tablet Take 1 tablet by mouth daily at 12 noon.       Diet: routine diet  Activity: Advance as tolerated. Pelvic rest for 6 weeks.   Outpatient follow up:6 weeks Follow up Appt:No future appointments. Follow up Visit:No Follow-up on file.  Postpartum contraception: Undecided  Newborn Data: Live born female  Birth Weight: 7 lb 6.3 oz (3354 g) APGAR: 9, 9  Baby Feeding: Bottle and Breast Disposition:home with mother   04/16/2017 Oliver Pila, MD

## 2018-09-15 ENCOUNTER — Inpatient Hospital Stay (HOSPITAL_COMMUNITY)
Admission: AD | Admit: 2018-09-15 | Discharge: 2018-09-15 | Disposition: A | Discharge status: Home / Self Care | Payer: Self-pay | Source: Ambulatory Visit | Attending: Obstetrics and Gynecology | Admitting: Obstetrics and Gynecology

## 2018-09-15 ENCOUNTER — Encounter (HOSPITAL_COMMUNITY): Payer: Self-pay | Admitting: *Deleted

## 2018-09-15 DIAGNOSIS — N898 Other specified noninflammatory disorders of vagina: Secondary | ICD-10-CM

## 2018-09-15 DIAGNOSIS — B9689 Other specified bacterial agents as the cause of diseases classified elsewhere: Secondary | ICD-10-CM | POA: Insufficient documentation

## 2018-09-15 DIAGNOSIS — R102 Pelvic and perineal pain: Secondary | ICD-10-CM | POA: Insufficient documentation

## 2018-09-15 DIAGNOSIS — N760 Acute vaginitis: Secondary | ICD-10-CM | POA: Insufficient documentation

## 2018-09-15 DIAGNOSIS — Z3202 Encounter for pregnancy test, result negative: Secondary | ICD-10-CM

## 2018-09-15 DIAGNOSIS — N390 Urinary tract infection, site not specified: Secondary | ICD-10-CM | POA: Insufficient documentation

## 2018-09-15 LAB — URINALYSIS, ROUTINE W REFLEX MICROSCOPIC
BACTERIA UA: NONE SEEN
BILIRUBIN URINE: NEGATIVE
Glucose, UA: NEGATIVE mg/dL
KETONES UR: NEGATIVE mg/dL
Nitrite: NEGATIVE
PROTEIN: NEGATIVE mg/dL
Specific Gravity, Urine: 1.027 (ref 1.005–1.030)
pH: 5 (ref 5.0–8.0)

## 2018-09-15 LAB — CBC WITH DIFFERENTIAL/PLATELET
BASOS PCT: 1 %
Basophils Absolute: 0 10*3/uL (ref 0.0–0.1)
EOS ABS: 0.3 10*3/uL (ref 0.0–0.5)
EOS PCT: 4 %
HCT: 37.5 % (ref 36.0–46.0)
Hemoglobin: 11.7 g/dL — ABNORMAL LOW (ref 12.0–15.0)
Lymphocytes Relative: 47 %
Lymphs Abs: 3.8 10*3/uL (ref 0.7–4.0)
MCH: 26.8 pg (ref 26.0–34.0)
MCHC: 31.2 g/dL (ref 30.0–36.0)
MCV: 86 fL (ref 80.0–100.0)
Monocytes Absolute: 0.3 10*3/uL (ref 0.1–1.0)
Monocytes Relative: 3 %
Neutro Abs: 3.7 10*3/uL (ref 1.7–7.7)
Neutrophils Relative %: 45 %
PLATELETS: 227 10*3/uL (ref 150–400)
RBC: 4.36 MIL/uL (ref 3.87–5.11)
RDW: 12.3 % (ref 11.5–15.5)
WBC: 8.1 10*3/uL (ref 4.0–10.5)
nRBC: 0 % (ref 0.0–0.2)

## 2018-09-15 LAB — WET PREP, GENITAL
Sperm: NONE SEEN
TRICH WET PREP: NONE SEEN
YEAST WET PREP: NONE SEEN

## 2018-09-15 LAB — POCT PREGNANCY, URINE: PREG TEST UR: NEGATIVE

## 2018-09-15 MED ORDER — SULFAMETHOXAZOLE-TRIMETHOPRIM 800-160 MG PO TABS: 1.0000 | ORAL_TABLET | Freq: Two times a day (BID) | ORAL | 0 refills | Status: AC

## 2018-09-15 MED ORDER — METRONIDAZOLE 500 MG PO TABS: 500.0000 mg | ORAL_TABLET | Freq: Two times a day (BID) | ORAL | Status: DC

## 2018-09-15 NOTE — MAU Note (Signed)
Pt reports really bad cramping with her last period which was 11/30. Also reports bleeding after intercourse recently. Sharp pelvic pain now.

## 2018-09-15 NOTE — MAU Provider Note (Signed)
History     CSN: [id number]  Arrival date and time: 09/15/18 1711   First Provider Initiated Contact with Patient 09/15/18 1901      Chief Complaint  Patient presents with  . Pelvic Pain   HPI Patricia Villanueva is a 27 y.o. [phone] non-pregnant patient who presents to MAU with chief complaint of postcoital bleeding and pelvic pain. These are new problems, onset within the past week. Patient states she has had one episode of bleeding immediately after intercourse. Bleeding then completely resolves.   Pelvic pain New onset, not related to vaginal bleeding. Patient endorses "big cramps", locus is bilateral low abdomen, does not radiate. Denies aggravating or alleviating factors. She has not taken medication or tried other treatments.   Patient denies urinary symptoms, pain during intercourse, flank pain, history of kidney stones, fever or recent illness.  Pertinent Gynecological History: Menses: flow is moderate Bleeding: postcoital bleeding Contraception: condoms DES exposure: denies Blood transfusions: none Sexually transmitted diseases: currently at risk Last pap: normal Date: summer 2019 per patient   Past Medical History:  Diagnosis Date  . Medical history non-contributory     Past Surgical History:  Procedure Laterality Date  . arm surgery     left arm surgery to repair a nerve  . arm surgery    . DENTAL SURGERY    . NERVE EXPLORATION  04/04/2012   Procedure: NERVE EXPLORATION;  Surgeon: Sharma Covert, MD;  Location: [facility] OR;  Service: Orthopedics;  Laterality: Left;  Left Arm Laceration with Nerve Repair    Family History  Problem Relation Age of Onset  . Hypertension Mother   . Alcohol abuse Neg Hx   . Arthritis Neg Hx   . Birth defects Neg Hx   . Asthma Neg Hx   . Cancer Neg Hx   . COPD Neg Hx   . Diabetes Neg Hx   . Depression Neg Hx   . Drug abuse Neg Hx   . Early death Neg Hx   . Hearing loss Neg Hx   . Heart disease Neg Hx   . Hyperlipidemia Neg Hx    . Kidney disease Neg Hx   . Learning disabilities Neg Hx   . Mental illness Neg Hx   . Mental retardation Neg Hx   . Miscarriages / Stillbirths Neg Hx   . Stroke Neg Hx   . Vision loss Neg Hx     Social History   Tobacco Use  . Smoking status: Never Smoker  . Smokeless tobacco: Never Used  Substance Use Topics  . Alcohol use: Yes  . Drug use: Not Currently    Types: Marijuana    Allergies: No Known Allergies  Medications Prior to Admission  Medication Sig Dispense Refill Last Dose  . ibuprofen (ADVIL,MOTRIN) 600 MG tablet Take 1 tablet (600 mg total) by mouth every 6 (six) hours. 30 tablet 0   . Prenatal Vit-Fe Fumarate-FA (PRENATAL MULTIVITAMIN) TABS tablet Take 1 tablet by mouth daily at 12 noon.   04/11/2017    Review of Systems  Constitutional: Negative for chills, fatigue and fever.  Gastrointestinal: Positive for abdominal pain.  Genitourinary: Positive for pelvic pain.  Musculoskeletal: Negative for back pain.  Neurological: Negative for dizziness and headaches.  All other systems reviewed and are negative.  Physical Exam   Blood pressure 115/64, pulse 68, temperature 98.5 F (36.9 C), temperature source Oral, resp. rate 17, weight 89.4 kg, last menstrual period 08/26/2018, SpO2 100 %, unknown if currently breastfeeding.  Physical Exam  Nursing note and vitals reviewed. Constitutional: She is oriented to person, place, and time. She appears well-developed and well-nourished.  Cardiovascular: Normal rate.  Respiratory: Effort normal and breath sounds normal.  GI: Soft. She exhibits no distension. There is no abdominal tenderness. There is no rebound, no guarding and no CVA tenderness.  Musculoskeletal: Normal range of motion.  Neurological: She is alert and oriented to person, place, and time.  Skin: Skin is warm and dry.  Psychiatric: She has a normal mood and affect. Her behavior is normal. Judgment and thought content normal.    MAU Course/MDM     --Treat for UTI now rather than waiting for culture based on UA and report of pain  Patient Vitals for the past 24 hrs:  BP Temp Temp src Pulse Resp SpO2 Weight  09/15/18 1732 115/64 98.5 F (36.9 C) Oral 68 17 100 % 89.4 kg    Results for orders placed or performed during the hospital encounter of 09/15/18 (from the past 24 hour(s))  Urinalysis, Routine w reflex microscopic     Status: Abnormal   Collection Time: 09/15/18  5:41 PM  Result Value Ref Range   Color, Urine YELLOW YELLOW   APPearance HAZY (A) CLEAR   Specific Gravity, Urine 1.027 1.005 - 1.030   pH 5.0 5.0 - 8.0   Glucose, UA NEGATIVE NEGATIVE mg/dL   Hgb urine dipstick MODERATE (A) NEGATIVE   Bilirubin Urine NEGATIVE NEGATIVE   Ketones, ur NEGATIVE NEGATIVE mg/dL   Protein, ur NEGATIVE NEGATIVE mg/dL   Nitrite NEGATIVE NEGATIVE   Leukocytes, UA MODERATE (A) NEGATIVE   RBC / HPF 6-10 0 - 5 RBC/hpf   WBC, UA 11-20 0 - 5 WBC/hpf   Bacteria, UA NONE SEEN NONE SEEN   Squamous Epithelial / LPF 0-5 0 - 5   Mucus PRESENT   Pregnancy, urine POC     Status: None   Collection Time: 09/15/18  5:44 PM  Result Value Ref Range   Preg Test, Ur NEGATIVE NEGATIVE  CBC with Differential/Platelet     Status: Abnormal   Collection Time: 09/15/18  6:42 PM  Result Value Ref Range   WBC 8.1 4.0 - 10.5 K/uL   RBC 4.36 3.87 - 5.11 MIL/uL   Hemoglobin 11.7 (L) 12.0 - 15.0 g/dL   HCT 16.137.5 09.636.0 - 04.546.0 %   MCV 86.0 80.0 - 100.0 fL   MCH 26.8 26.0 - 34.0 pg   MCHC 31.2 30.0 - 36.0 g/dL   RDW 40.912.3 81.111.5 - 91.415.5 %   Platelets 227 150 - 400 K/uL   nRBC 0.0 0.0 - 0.2 %   Neutrophils Relative % 45 %   Neutro Abs 3.7 1.7 - 7.7 K/uL   Lymphocytes Relative 47 %   Lymphs Abs 3.8 0.7 - 4.0 K/uL   Monocytes Relative 3 %   Monocytes Absolute 0.3 0.1 - 1.0 K/uL   Eosinophils Relative 4 %   Eosinophils Absolute 0.3 0.0 - 0.5 K/uL   Basophils Relative 1 %   Basophils Absolute 0.0 0.0 - 0.1 K/uL  Wet prep, genital     Status: Abnormal    Collection Time: 09/15/18  7:10 PM  Result Value Ref Range   Yeast Wet Prep HPF POC NONE SEEN NONE SEEN   Trich, Wet Prep NONE SEEN NONE SEEN   Clue Cells Wet Prep HPF POC PRESENT (A) NONE SEEN   WBC, Wet Prep HPF POC MANY (A) NONE SEEN   Sperm NONE SEEN  Meds ordered this encounter  Medications  . sulfamethoxazole-trimethoprim (BACTRIM DS,SEPTRA DS) 800-160 MG tablet    Sig: Take 1 tablet by mouth 2 (two) times daily for 7 days.    Dispense:  14 tablet    Refill:  0    Order Specific Question:   Supervising Provider    Answer:   Reva Bores [[id number]]  . metroNIDAZOLE (FLAGYL) tablet 500 mg    Assessment and Plan  --27 y.o. [id number] negative pregnancy test --UTI and Bacterial Vaginosis, rx to pharmacy --Discharge home in stable condition  Calvert Cantor, CNM 09/15/2018, 7:35 PM

## 2018-09-16 ENCOUNTER — Encounter: Payer: Self-pay | Admitting: Advanced Practice Midwife

## 2018-09-16 ENCOUNTER — Other Ambulatory Visit: Payer: Self-pay | Admitting: Advanced Practice Midwife

## 2018-09-16 DIAGNOSIS — A749 Chlamydial infection, unspecified: Secondary | ICD-10-CM | POA: Insufficient documentation

## 2018-09-16 LAB — GC/CHLAMYDIA PROBE AMP (~~LOC~~) NOT AT ARMC
Chlamydia: POSITIVE — AB
Neisseria Gonorrhea: NEGATIVE

## 2018-09-16 MED ORDER — AZITHROMYCIN 500 MG PO TABS: 1000.0000 mg | ORAL_TABLET | Freq: Once | ORAL | 1 refills | Status: AC

## 2018-09-16 NOTE — Progress Notes (Signed)
Positive for Chlamydia, results and prescription information communicated via active MyChart account.  Patricia Villanueva, CNM 09/16/18 7:06 PM

## 2018-09-17 LAB — URINE CULTURE: CULTURE: NO GROWTH

## 2019-07-06 ENCOUNTER — Encounter (HOSPITAL_COMMUNITY): Payer: Self-pay | Admitting: Emergency Medicine

## 2019-07-06 ENCOUNTER — Emergency Department (HOSPITAL_COMMUNITY)
Admission: EM | Admit: 2019-07-06 | Discharge: 2019-07-06 | Disposition: A | Discharge status: Home / Self Care | Payer: Self-pay | Attending: Emergency Medicine | Admitting: Emergency Medicine

## 2019-07-06 DIAGNOSIS — L918 Other hypertrophic disorders of the skin: Secondary | ICD-10-CM | POA: Insufficient documentation

## 2019-07-06 NOTE — Discharge Instructions (Addendum)
Please read attached information. If you experience any new or worsening signs or symptoms please return to the emergency room for evaluation. Please follow-up with your primary care provider or specialist as discussed. Please use antibiotic ointment on the area as needed.

## 2019-07-06 NOTE — ED Triage Notes (Signed)
pt has swelling to right side of nose and states she has had a bump there for 1 month that the swelling and pain has recently got worse.

## 2019-07-06 NOTE — ED Notes (Signed)
ED Provider at bedside. 

## 2019-07-06 NOTE — ED Provider Notes (Signed)
[place] EMERGENCY DEPARTMENT Provider Note   CSN: [id number] Arrival date & time: 07/06/19  0830     History   Chief Complaint Chief Complaint  Patient presents with  . Abscess    HPI Patricia Villanueva is a 28 y.o. female.     HPI    28 year old female presents today with a bump to her right nostril.  She notes symptoms started approximate 1 month ago.  Originally she had some blood and pus from the area, no longer having this.  She notes this is painful to touch.  She notes using hydrocortisone cream and Vaseline without improvement in symptoms. Abscess right nostril over the last month      Past Medical History:  Diagnosis Date  . Medical history non-contributory     Patient Active Problem List   Diagnosis Date Noted  . Chlamydia 09/16/2018  . Normal pregnancy in third trimester 04/15/2017  . Active labor 11/04/2013  . NSVD (normal spontaneous vaginal delivery) 11/04/2013  . Secondary amenorrhea 02/13/2013    Past Surgical History:  Procedure Laterality Date  . arm surgery     left arm surgery to repair a nerve  . arm surgery    . DENTAL SURGERY    . NERVE EXPLORATION  04/04/2012   Procedure: NERVE EXPLORATION;  Surgeon: Linna Hoff, MD;  Location: [place];  Service: Orthopedics;  Laterality: Left;  Left Arm Laceration with Nerve Repair     OB History    Gravida  2   Para  2   Term  2   Preterm  0   AB  0   Living  2     SAB  0   TAB  0   Ectopic  0   Multiple  0   Live Births  2            Home Medications    Prior to Admission medications   Medication Sig Start Date End Date Taking? Authorizing Provider  ibuprofen (ADVIL,MOTRIN) 600 MG tablet Take 1 tablet (600 mg total) by mouth every 6 (six) hours. 04/16/17   Paula Compton, MD  Prenatal Vit-Fe Fumarate-FA (PRENATAL MULTIVITAMIN) TABS tablet Take 1 tablet by mouth daily at 12 noon.    [provider]    Family History Family History  Problem  Relation Age of Onset  . Hypertension Mother   . Alcohol abuse Neg Hx   . Arthritis Neg Hx   . Birth defects Neg Hx   . Asthma Neg Hx   . Cancer Neg Hx   . COPD Neg Hx   . Diabetes Neg Hx   . Depression Neg Hx   . Drug abuse Neg Hx   . Early death Neg Hx   . Hearing loss Neg Hx   . Heart disease Neg Hx   . Hyperlipidemia Neg Hx   . Kidney disease Neg Hx   . Learning disabilities Neg Hx   . Mental illness Neg Hx   . Mental retardation Neg Hx   . Miscarriages / Stillbirths Neg Hx   . Stroke Neg Hx   . Vision loss Neg Hx     Social History Social History   Tobacco Use  . Smoking status: Never Smoker  . Smokeless tobacco: Never Used  Substance Use Topics  . Alcohol use: Yes  . Drug use: Not Currently    Types: Marijuana     Allergies   Patient has no known allergies.  Review of Systems Review of Systems  All other systems reviewed and are negative.    Physical Exam Updated Vital Signs BP 135/88 (BP Location: Right Arm)   Pulse 62   Temp 98.3 F (36.8 C) (Oral)   Resp 16   LMP 06/23/2019   SpO2 100%   Physical Exam Vitals signs and nursing note reviewed.  Constitutional:      Appearance: She is well-developed.  HENT:     Head: Normocephalic and atraumatic.     Nose:     Comments: Skin tag noted to the right nare no surrounding erythema or bleeding Eyes:     General: No scleral icterus.       Right eye: No discharge.        Left eye: No discharge.     Conjunctiva/sclera: Conjunctivae normal.     Pupils: Pupils are equal, round, and reactive to light.  Neck:     Musculoskeletal: Normal range of motion.     Vascular: No JVD.     Trachea: No tracheal deviation.  Pulmonary:     Effort: Pulmonary effort is normal.     Breath sounds: No stridor.  Neurological:     Mental Status: She is alert and oriented to person, place, and time.     Coordination: Coordination normal.  Psychiatric:        Behavior: Behavior normal.        Thought Content:  Thought content normal.        Judgment: Judgment normal.      ED Treatments / Results  Labs (all labs ordered are listed, but only abnormal results are displayed) Labs Reviewed - No data to display  EKG None  Radiology No results found.  Procedures Procedures (including critical care time)  Medications Ordered in ED Medications - No data to display   Initial Impression / Assessment and Plan / ED Course  I have reviewed the triage vital signs and the nursing notes.  Pertinent labs & imaging results that were available during my care of the patient were reviewed by me and considered in my medical decision making (see chart for details).        28 year old female presents today with likely skin tag versus nevus on her nose.  She be referred out for removal.  Return precautions given.  Verbalized understanding and agreement to this plan.  Final Clinical Impressions(s) / ED Diagnoses   Final diagnoses:  Skin tag    ED Discharge Orders    None       Eyvonne Mechanic, PA-C 07/06/19 1045    Pricilla Loveless, MD 07/07/19 [phone]

## 2019-07-16 ENCOUNTER — Other Ambulatory Visit: Payer: Self-pay

## 2019-07-16 DIAGNOSIS — Z20822 Contact with and (suspected) exposure to covid-19: Secondary | ICD-10-CM

## 2019-07-18 LAB — NOVEL CORONAVIRUS, NAA: SARS-CoV-2, NAA: NOT DETECTED

## 2019-09-11 ENCOUNTER — Other Ambulatory Visit: Payer: Self-pay

## 2019-09-11 ENCOUNTER — Inpatient Hospital Stay (HOSPITAL_COMMUNITY): Payer: Self-pay

## 2019-09-11 ENCOUNTER — Other Ambulatory Visit: Payer: Self-pay | Admitting: Physician Assistant

## 2019-09-11 ENCOUNTER — Encounter (HOSPITAL_COMMUNITY): Payer: Self-pay | Admitting: Emergency Medicine

## 2019-09-11 ENCOUNTER — Inpatient Hospital Stay (HOSPITAL_COMMUNITY)
Admission: EM | Admit: 2019-09-11 | Discharge: 2019-09-11 | Disposition: A | Discharge status: Other Acute Inpatient Hospital | Payer: Self-pay | Attending: Family Medicine | Admitting: Family Medicine

## 2019-09-11 DIAGNOSIS — O26899 Other specified pregnancy related conditions, unspecified trimester: Secondary | ICD-10-CM

## 2019-09-11 DIAGNOSIS — O26891 Other specified pregnancy related conditions, first trimester: Secondary | ICD-10-CM | POA: Insufficient documentation

## 2019-09-11 DIAGNOSIS — Z3A00 Weeks of gestation of pregnancy not specified: Secondary | ICD-10-CM | POA: Insufficient documentation

## 2019-09-11 DIAGNOSIS — R109 Unspecified abdominal pain: Secondary | ICD-10-CM

## 2019-09-11 DIAGNOSIS — R102 Pelvic and perineal pain: Secondary | ICD-10-CM | POA: Insufficient documentation

## 2019-09-11 DIAGNOSIS — Z3A01 Less than 8 weeks gestation of pregnancy: Secondary | ICD-10-CM

## 2019-09-11 DIAGNOSIS — O3680X0 Pregnancy with inconclusive fetal viability, not applicable or unspecified: Secondary | ICD-10-CM

## 2019-09-11 LAB — COMPREHENSIVE METABOLIC PANEL
ALT: 14 U/L (ref 0–44)
AST: 14 U/L — ABNORMAL LOW (ref 15–41)
Albumin: 3.7 g/dL (ref 3.5–5.0)
Alkaline Phosphatase: 49 U/L (ref 38–126)
Anion gap: 6 (ref 5–15)
BUN: 9 mg/dL (ref 6–20)
CO2: 24 mmol/L (ref 22–32)
Calcium: 9.1 mg/dL (ref 8.9–10.3)
Chloride: 106 mmol/L (ref 98–111)
Creatinine, Ser: 0.91 mg/dL (ref 0.44–1.00)
GFR calc Af Amer: >60 mL/min (ref >60)
GFR calc non Af Amer: >60 mL/min (ref >60)
Glucose, Bld: 106 mg/dL — ABNORMAL HIGH (ref 70–99)
Potassium: 3.9 mmol/L (ref 3.5–5.1)
Sodium: 136 mmol/L (ref 135–145)
Total Bilirubin: 0.8 mg/dL (ref 0.3–1.2)
Total Protein: 6.8 g/dL (ref 6.5–8.1)

## 2019-09-11 LAB — CBC
HCT: 40.3 % (ref 36.0–46.0)
Hemoglobin: 12.5 g/dL (ref 12.0–15.0)
MCH: 27.2 pg (ref 26.0–34.0)
MCHC: 31 g/dL (ref 30.0–36.0)
MCV: 87.8 fL (ref 80.0–100.0)
Platelets: 219 10*3/uL (ref 150–400)
RBC: 4.59 MIL/uL (ref 3.87–5.11)
RDW: 12 % (ref 11.5–15.5)
WBC: 5.9 10*3/uL (ref 4.0–10.5)
nRBC: 0 % (ref 0.0–0.2)

## 2019-09-11 LAB — URINALYSIS, ROUTINE W REFLEX MICROSCOPIC
Bacteria, UA: NONE SEEN
Bilirubin Urine: NEGATIVE
Glucose, UA: NEGATIVE mg/dL
Hgb urine dipstick: NEGATIVE
Ketones, ur: NEGATIVE mg/dL
Nitrite: NEGATIVE
Protein, ur: NEGATIVE mg/dL
Specific Gravity, Urine: 1.02 (ref 1.005–1.030)
pH: 6 (ref 5.0–8.0)

## 2019-09-11 LAB — WET PREP, GENITAL
Clue Cells Wet Prep HPF POC: NONE SEEN
Sperm: NONE SEEN
Trich, Wet Prep: NONE SEEN
Yeast Wet Prep HPF POC: NONE SEEN

## 2019-09-11 LAB — LIPASE, BLOOD: Lipase: 24 U/L (ref 11–51)

## 2019-09-11 LAB — I-STAT BETA HCG BLOOD, ED (MC, WL, AP ONLY): I-stat hCG, quantitative: 1790.6 m[IU]/mL — ABNORMAL HIGH (ref <5)

## 2019-09-11 MED ORDER — SODIUM CHLORIDE 0.9% FLUSH: 3.0000 mL | Freq: Once | INTRAVENOUS | Status: DC

## 2019-09-11 MED ORDER — PROMETHAZINE HCL 25 MG PO TABS: 25.0000 mg | ORAL_TABLET | Freq: Four times a day (QID) | ORAL | 0 refills | Status: DC | PRN

## 2019-09-11 NOTE — ED Triage Notes (Signed)
C/o lower abd pain x 1 week with nausea.  Denies vomiting, diarrhea, and urinary complaints.

## 2019-09-11 NOTE — MAU Note (Signed)
Patricia Villanueva is a 28 y.o. at Unknown here in MAU reporting: + UPT 2 days ago. Been having abdominal pain for the past week, it is getting worse. No vaginal bleeding or discharge.   LMP: had a cycle in the middle of august, irregular periods  Onset of complaint: a week, getting worse  Pain score: 8/10  Vitals:   09/11/19 1252 09/11/19 1524  BP: 119/77 124/72  Pulse: 87 78  Resp: 20 18  Temp: 98.6 F (37 C) 98.4 F (36.9 C)  SpO2: 99% 100%     Lab orders placed from triage: none

## 2019-09-11 NOTE — ED Provider Notes (Signed)
[facility] Provider [facility]   CSN: [id number] Arrival date & time: 09/11/19  1246     History Chief Complaint  Patient presents with  . Abdominal Pain    Remedy Sarita Haver Wilcock is a 28 y.o. female G2P2 with no significant PMH who presents to the ED with a 1 week history of intermittent turned constant pelvic and suprapubic discomfort, particularly on left side.  Patient reports that the discomfort is worse when she is lying flat.  She also endorses occasional nausea, but vomiting, change in bowel habits, dysuria, increased urinary frequency, vaginal bleeding or discharge, fevers or chills, syncope, recent illness, or mid to upper abdominal discomfort.  She reports that she typically has irregular menses spaced 6 to 8 weeks apart so she was not particularly concerned when she noted her last LMP was in August.  She does not see OB/GYN for PCP for outpatient follow-up.  She denies any history of abdominal surgeries or tubal ligation.  She uses condoms intermittently, no other contraceptive methods.  Her 2 births were vaginal delivery, full-term, uncomplicated.   HPI     Past Medical History:  Diagnosis Date  . Medical history non-contributory     Patient Active Problem List   Diagnosis Date Noted  . Chlamydia 09/16/2018  . Normal pregnancy in third trimester 04/15/2017  . Active labor 11/04/2013  . NSVD (normal spontaneous vaginal delivery) 11/04/2013  . Secondary amenorrhea 02/13/2013    Past Surgical History:  Procedure Laterality Date  . arm surgery     left arm surgery to repair a nerve  . arm surgery    . DENTAL SURGERY    . NERVE EXPLORATION  04/04/2012   Procedure: NERVE EXPLORATION;  Surgeon: Sharma CovertFred W Ortmann, MD;  [facility];  Service: Orthopedics;  Laterality: Left;  Left Arm Laceration with Nerve Repair     OB History    Gravida  2   Para  2   Term  2   Preterm  0   AB  0   Living  2     SAB  0   TAB  0   Ectopic  0     Multiple  0   Live Births  2           Family History  Problem Relation Age of Onset  . Hypertension Mother   . Alcohol abuse Neg Hx   . Arthritis Neg Hx   . Birth defects Neg Hx   . Asthma Neg Hx   . Cancer Neg Hx   . COPD Neg Hx   . Diabetes Neg Hx   . Depression Neg Hx   . Drug abuse Neg Hx   . Early death Neg Hx   . Hearing loss Neg Hx   . Heart disease Neg Hx   . Hyperlipidemia Neg Hx   . Kidney disease Neg Hx   . Learning disabilities Neg Hx   . Mental illness Neg Hx   . Mental retardation Neg Hx   . Miscarriages / Stillbirths Neg Hx   . Stroke Neg Hx   . Vision loss Neg Hx     Social History   Tobacco Use  . Smoking status: Never Smoker  . Smokeless tobacco: Never Used  Substance Use Topics  . Alcohol use: Yes  . Drug use: Not Currently    Types: Marijuana    Home Medications Prior to Admission medications   Medication Sig Start Date End Date  Taking? Authorizing Provider  ibuprofen (ADVIL,MOTRIN) 600 MG tablet Take 1 tablet (600 mg total) by mouth every 6 (six) hours. 04/16/17   Huel Cote, MD  Prenatal Vit-Fe Fumarate-FA (PRENATAL MULTIVITAMIN) TABS tablet Take 1 tablet by mouth daily at 12 noon.    [provider]    Allergies    Patient has no known allergies.  Review of Systems   Review of Systems  All other systems reviewed and are negative.   Physical Exam Updated Vital Signs BP 119/77 (BP Location: Right Arm)   Pulse 87   Temp 98.6 F (37 C) (Oral)   Resp 20   SpO2 99%   Physical Exam Vitals and nursing note reviewed. Exam conducted with a chaperone present.  Constitutional:      Appearance: Normal appearance.  HENT:     Head: Normocephalic and atraumatic.  Eyes:     General: No scleral icterus.    Conjunctiva/sclera: Conjunctivae normal.  Cardiovascular:     Rate and Rhythm: Normal rate and regular rhythm.     Pulses: Normal pulses.     Heart sounds: Normal heart sounds.  Pulmonary:     Effort:  Pulmonary effort is normal. No respiratory distress.  Skin:    General: Skin is dry.  Neurological:     Mental Status: She is alert.     GCS: GCS eye subscore is 4. GCS verbal subscore is 5. GCS motor subscore is 6.  Psychiatric:        Mood and Affect: Mood normal.        Behavior: Behavior normal.        Thought Content: Thought content normal.     ED Results / Procedures / Treatments   Labs (all labs ordered are listed, but only abnormal results are displayed) Labs Reviewed  COMPREHENSIVE METABOLIC PANEL - Abnormal; Notable for the following components:      Result Value   Glucose, Bld 106 (*)    AST 14 (*)    All other components within normal limits  URINALYSIS, ROUTINE W REFLEX MICROSCOPIC - Abnormal; Notable for the following components:   APPearance HAZY (*)    Leukocytes,Ua TRACE (*)    All other components within normal limits  I-STAT BETA HCG BLOOD, ED ([facility], [facility], [facility] ONLY) - Abnormal; Notable for the following components:   I-stat hCG, quantitative 1,790.6 (*)    All other components within normal limits  WET PREP, GENITAL  LIPASE, BLOOD  CBC  GC/CHLAMYDIA PROBE AMP ([place]) NOT AT [facility]    EKG None  Radiology No results found.  Procedures Procedures (including critical care time)  Medications Ordered in ED Medications  sodium chloride flush (NS) 0.9 % injection 3 mL (has no administration in time range)    ED Course  I have reviewed the triage vital signs and the nursing notes.  Pertinent labs & imaging results that were available during my care of the patient were reviewed by me and considered in my medical decision making (see chart for details).    MDM Rules/Calculators/A&P       Spoke with the MAU provider at [facility] hospital and will transfer patient based on her reported symptoms in setting of pregnancy.  Patient denies any syncopal episodes and she is stable for transfer at this time.  She is hemodynamically stable in no acute  distress.  Prior to transfer, I attempted to order ultrasound to rule out ectopic pregnancy as well as a Doppler to rule out torsion.  Would also have obtained STI testing, per patient request, as well as a wet prep and bimanual exam. Patient is understanding and agreeable to current plan and transfer.   Final Clinical Impression(s) / ED Diagnoses Final diagnoses:  Pelvic pain affecting pregnancy in first trimester, antepartum    Rx / DC Orders ED Discharge Orders    None       Corena Herter, PA-C 09/11/19 1456    Lajean Saver, MD 09/12/19 [phone]

## 2019-09-11 NOTE — ED Notes (Signed)
Report called to MAU  

## 2019-09-11 NOTE — Discharge Instructions (Signed)
Abdominal Pain During Pregnancy  Abdominal pain is common during pregnancy, and has many possible causes. Some causes are more serious than others, and sometimes the cause is not known. Abdominal pain can be a sign that labor is starting. It can also be caused by normal growth and stretching of muscles and ligaments during pregnancy. Always tell your health care provider if you have any abdominal pain. Follow these instructions at home:  Do not have sex or put anything in your vagina until your pain goes away completely.  Get plenty of rest until your pain improves.  Drink enough fluid to keep your urine pale yellow.  Take over-the-counter and prescription medicines only as told by your health care provider.  Keep all follow-up visits as told by your health care provider. This is important. Contact a health care provider if:  Your pain continues or gets worse after resting.  You have lower abdominal pain that: ? Comes and goes at regular intervals. ? Spreads to your back. ? Is similar to menstrual cramps.  You have pain or burning when you urinate. Get help right away if:  You have a fever or chills.  You have vaginal bleeding.  You are leaking fluid from your vagina.  You are passing tissue from your vagina.  You have vomiting or diarrhea that lasts for more than 24 hours.  Your baby is moving less than usual.  You feel very weak or faint.  You have shortness of breath.  You develop severe pain in your upper abdomen. Summary  Abdominal pain is common during pregnancy, and has many possible causes.  If you experience abdominal pain during pregnancy, tell your health care provider right away.  Follow your health care provider's home care instructions and keep all follow-up visits as directed. This information is not intended to replace advice given to you by your health care provider. Make sure you discuss any questions you have with your health care  provider. Document Released: 09/16/2005 Document Revised: 01/04/2019 Document Reviewed: 12/19/2016 Elsevier Patient Education  2020 [employer]. Ectopic Pregnancy  An ectopic pregnancy happens when a fertilized egg grows outside the womb (uterus). The fertilized egg cannot stay alive outside of the womb. This problem often happens in a fallopian tube. It is often caused by damage to the tube. If this problem is found early, you may be treated with medicine that stops the egg from growing. If your tube tears or bursts open (ruptures), you will bleed inside. Often, there is very bad pain in the lower belly. This is an emergency. You will need surgery. Get help right away. Follow these instructions at home: After being treated with medicine or surgery:  Rest and limit your activity for as long as told by your doctor.  Until your doctor says that it is safe: ? Do not lift anything that is heavier than 10 lb (4.5 kg) or the limit that your doctor tells you. ? Avoid exercise and any movement that takes a lot of effort.  To prevent problems when pooping (constipation): ? Eat a healthy diet. This includes:  Fruits.  Vegetables.  Whole grains. ? Drink 6-8 glasses of water a day. Contact a doctor if: Get help right away if:  You have sudden and very bad pain in your belly.  You have very bad pain in your shoulders or neck.  You have pain that gets worse and is not helped by medicine.  You have: ? A fever or chills. ? Vaginal  bleeding. ? Redness or swelling at the site of a surgical cut (incision).  You feel sick to your stomach (nauseous) or you throw up (vomit).  You feel dizzy or weak.  You feel light-headed or you pass out (faint). Summary  An ectopic pregnancy happens when a fertilized egg grows outside the womb (uterus).  If this problem is found early, you may be treated with medicine that stops the egg from growing.  If your tube tears or bursts open (ruptures), you  will need surgery. This is an emergency. Get help right away. This information is not intended to replace advice given to you by your health care provider. Make sure you discuss any questions you have with your health care provider. Document Released: 12/13/2008 Document Revised: 08/29/2017 Document Reviewed: 10/10/2016 Elsevier Patient Education  2020 [employer].

## 2019-09-11 NOTE — MAU Provider Note (Signed)
History     CSN: [id number]  Arrival date and time: 09/11/19 1246  Chief Complaint  Patient presents with  . Abdominal Pain   HPI Patricia Villanueva is a 28 y.o. G3P2002 at Unknown gestational age who presents with lower abdominal pain and nausea x1 week. She denies any bleeding or vaginal discharge. She reports a hx of irregular periods and her last period was in August. This is not abnormal for her. She reports intermittent condom use for contraception. She denies any vomiting.   OB History    Gravida  3   Para  2   Term  2   Preterm  0   AB  0   Living  2     SAB  0   TAB  0   Ectopic  0   Multiple  0   Live Births  2           Past Medical History:  Diagnosis Date  . Medical history non-contributory     Past Surgical History:  Procedure Laterality Date  . arm surgery     left arm surgery to repair a nerve  . arm surgery    . DENTAL SURGERY    . NERVE EXPLORATION  04/04/2012   Procedure: NERVE EXPLORATION;  Surgeon: Linna Hoff, MD;  Location: [facility];  Service: Orthopedics;  Laterality: Left;  Left Arm Laceration with Nerve Repair    Family History  Problem Relation Age of Onset  . Hypertension Mother   . Alcohol abuse Neg Hx   . Arthritis Neg Hx   . Birth defects Neg Hx   . Asthma Neg Hx   . Cancer Neg Hx   . COPD Neg Hx   . Diabetes Neg Hx   . Depression Neg Hx   . Drug abuse Neg Hx   . Early death Neg Hx   . Hearing loss Neg Hx   . Heart disease Neg Hx   . Hyperlipidemia Neg Hx   . Kidney disease Neg Hx   . Learning disabilities Neg Hx   . Mental illness Neg Hx   . Mental retardation Neg Hx   . Miscarriages / Stillbirths Neg Hx   . Stroke Neg Hx   . Vision loss Neg Hx     Social History   Tobacco Use  . Smoking status: Never Smoker  . Smokeless tobacco: Never Used  Substance Use Topics  . Alcohol use: Yes  . Drug use: Not Currently    Types: Marijuana    Allergies: No Known Allergies  Medications Prior to Admission   Medication Sig Dispense Refill Last Dose  . ibuprofen (ADVIL,MOTRIN) 600 MG tablet Take 1 tablet (600 mg total) by mouth every 6 (six) hours. 30 tablet 0   . Prenatal Vit-Fe Fumarate-FA (PRENATAL MULTIVITAMIN) TABS tablet Take 1 tablet by mouth daily at 12 noon.       Review of Systems  Constitutional: Negative.  Negative for fatigue and fever.  HENT: Negative.   Respiratory: Negative.  Negative for shortness of breath.   Cardiovascular: Negative.  Negative for chest pain.  Gastrointestinal: Positive for abdominal pain and nausea. Negative for constipation, diarrhea and vomiting.  Genitourinary: Negative.  Negative for dysuria, vaginal bleeding and vaginal discharge.  Neurological: Negative.  Negative for dizziness and headaches.   Physical Exam   Blood pressure 124/72, pulse 78, temperature 98.4 F (36.9 C), temperature source Oral, resp. rate 18, height 5\' 7"  (1.702 m), weight 92.6 kg,  SpO2 100 %, unknown if currently breastfeeding.  Physical Exam  Nursing note and vitals reviewed. Constitutional: She is oriented to person, place, and time. She appears well-developed and well-nourished. No distress.  HENT:  Head: Normocephalic.  Eyes: Pupils are equal, round, and reactive to light.  Cardiovascular: Normal rate, regular rhythm and normal heart sounds.  Respiratory: Effort normal and breath sounds normal. No respiratory distress.  GI: Soft. Bowel sounds are normal. She exhibits no distension. There is no abdominal tenderness.  Neurological: She is alert and oriented to person, place, and time.  Skin: Skin is warm and dry.  Psychiatric: She has a normal mood and affect. Her behavior is normal. Judgment and thought content normal.    MAU Course  Procedures Results for orders placed or performed during the hospital encounter of 09/11/19 (from the past 24 hour(s))  Lipase, blood     Status: None   Collection Time: 09/11/19  1:09 PM  Result Value Ref Range   Lipase 24 11 - 51 U/L   Comprehensive metabolic panel     Status: Abnormal   Collection Time: 09/11/19  1:09 PM  Result Value Ref Range   Sodium 136 135 - 145 mmol/L   Potassium 3.9 3.5 - 5.1 mmol/L   Chloride 106 98 - 111 mmol/L   CO2 24 22 - 32 mmol/L   Glucose, Bld 106 (H) 70 - 99 mg/dL   BUN 9 6 - 20 mg/dL   Creatinine, Ser 6.960.91 0.44 - 1.00 mg/dL   Calcium 9.1 8.9 - 29.510.3 mg/dL   Total Protein 6.8 6.5 - 8.1 g/dL   Albumin 3.7 3.5 - 5.0 g/dL   AST 14 (L) 15 - 41 U/L   ALT 14 0 - 44 U/L   Alkaline Phosphatase 49 38 - 126 U/L   Total Bilirubin 0.8 0.3 - 1.2 mg/dL   GFR calc non Af Amer >60 >60 mL/min   GFR calc Af Amer >60 >60 mL/min   Anion gap 6 5 - 15  CBC     Status: None   Collection Time: 09/11/19  1:09 PM  Result Value Ref Range   WBC 5.9 4.0 - 10.5 K/uL   RBC 4.59 3.87 - 5.11 MIL/uL   Hemoglobin 12.5 12.0 - 15.0 g/dL   HCT 28.440.3 13.236.0 - 44.046.0 %   MCV 87.8 80.0 - 100.0 fL   MCH 27.2 26.0 - 34.0 pg   MCHC 31.0 30.0 - 36.0 g/dL   RDW 10.212.0 72.511.5 - 36.615.5 %   Platelets 219 150 - 400 K/uL   nRBC 0.0 0.0 - 0.2 %  Urinalysis, Routine w reflex microscopic     Status: Abnormal   Collection Time: 09/11/19  1:32 PM  Result Value Ref Range   Color, Urine YELLOW YELLOW   APPearance HAZY (A) CLEAR   Specific Gravity, Urine 1.020 1.005 - 1.030   pH 6.0 5.0 - 8.0   Glucose, UA NEGATIVE NEGATIVE mg/dL   Hgb urine dipstick NEGATIVE NEGATIVE   Bilirubin Urine NEGATIVE NEGATIVE   Ketones, ur NEGATIVE NEGATIVE mg/dL   Protein, ur NEGATIVE NEGATIVE mg/dL   Nitrite NEGATIVE NEGATIVE   Leukocytes,Ua TRACE (A) NEGATIVE   RBC / HPF 0-5 0 - 5 RBC/hpf   WBC, UA 0-5 0 - 5 WBC/hpf   Bacteria, UA NONE SEEN NONE SEEN   Squamous Epithelial / LPF 6-10 0 - 5   Mucus PRESENT   I-Stat beta hCG blood, ED     Status: Abnormal  Collection Time: 09/11/19  2:09 PM  Result Value Ref Range   I-stat hCG, quantitative 1,790.6 (H) <5 mIU/mL   Comment 3          Wet prep, genital     Status: Abnormal   Collection Time:  09/11/19  3:55 PM   Specimen: Cervix  Result Value Ref Range   Yeast Wet Prep HPF POC NONE SEEN NONE SEEN   Trich, Wet Prep NONE SEEN NONE SEEN   Clue Cells Wet Prep HPF POC NONE SEEN NONE SEEN   WBC, Wet Prep HPF POC MANY (A) NONE SEEN   Sperm NONE SEEN    US OB LESS THAN 14 WEEKS WITH OB TRANSVAGINAL  Result Date: 09/11/2019 CLINICAL DATA:  Abdominal pain. Pregnant patient. Beta HCG level 1,790. EXAM: OBSTETRIC <14 WK [place] AND TRANSVAGINAL OB US TECHNIQUE: Both transabdominal and transvaginal ultrasound examinations were performed for complete evaluation of the gestation as well as the maternal uterus, adnexal regions, and pelvic cul-de-sac. Transvaginal technique was performed to assess early pregnancy. COMPARISON:  None. FINDINGS: Intrauterine gestational sac: Single Yolk sac:  Not Visualized. Embryo:  Not Visualized. Cardiac Activity: Not applicable. Maternal uterus/adnexae: No uterine masses. Cervix appears closed. Ovaries normal in overall size. Left ovary corpus luteum measuring 2.1 x 1.7 x 1.8 cm. No adnexal masses. Small amount of pelvic free fluid. IMPRESSION: 1. Probable early intrauterine gestational sac, but no yolk sac, fetal pole, or cardiac activity yet visualized. Recommend follow-up quantitative B-HCG levels and follow-up US in 14 days to assess viability. This recommendation follows SRU consensus guidelines: Diagnostic Criteria for Nonviable Pregnancy Early in the First Trimester. Malva Limes Med 2013; 174:9449-67. 2. Small amount of pelvic free fluid may be physiologic. Left ovarian corpus luteum. Ovary/adnexa otherwise unremarkable. Electronically Signed   By: Amie Portland M.D.   On: 09/11/2019 16:52   MDM UA, UPT CBC, HCG ABO/Rh- A Pos Wet prep and gc/chlamydia US OB Comp Less 14 weeks with Transvaginal   Assessment and Plan   1. Pelvic pain affecting pregnancy in first trimester, antepartum   2. Abdominal pain affecting pregnancy   3. Pregnancy of unknown anatomic  location    -Discharge home in stable condition -Rx for phenergan -Strict ectopic precautions discussed -Patient advised to follow-up with [facility] on Monday at 0900 for repeat HCG -Patient may return to MAU as needed or if her condition were to change or worsen   Rolm Bookbinder CNM 09/11/2019, 5:03 PM

## 2019-09-13 ENCOUNTER — Ambulatory Visit: Payer: Self-pay

## 2019-09-14 ENCOUNTER — Encounter: Payer: Self-pay | Admitting: *Deleted

## 2019-09-14 ENCOUNTER — Other Ambulatory Visit: Payer: Self-pay

## 2019-09-14 ENCOUNTER — Ambulatory Visit (INDEPENDENT_AMBULATORY_CARE_PROVIDER_SITE_OTHER): Payer: Self-pay | Admitting: *Deleted

## 2019-09-14 ENCOUNTER — Ambulatory Visit: Payer: Self-pay

## 2019-09-14 VITALS — BP 127/82 | HR 74

## 2019-09-14 DIAGNOSIS — O3680X0 Pregnancy with inconclusive fetal viability, not applicable or unspecified: Secondary | ICD-10-CM

## 2019-09-14 LAB — BETA HCG QUANT (REF LAB): hCG Quant: 5885 m[IU]/mL

## 2019-09-14 NOTE — Progress Notes (Signed)
Pt reports abdominal pain is no greater than when @ MAU on 12/12 - slightly less. She reports pain scale of 5 and has not taken any pain reliever.  Pt advised she will be called with stat BHCG results today. She voiced understanding and agreed to plan of care.   1400  BHCG shows appropriate rise - results reviewed with Dr. Kennon Rounds. Follow up US for viability ordered.    [place] pt and left VM stating that I am calling with test results. I will send a secure message to her MyChart with details. She may send a return message if she has questions.

## 2019-09-15 NOTE — Progress Notes (Signed)
Patient seen and assessed by nursing staff.  Agree with documentation and plan.  

## 2019-09-28 ENCOUNTER — Ambulatory Visit (HOSPITAL_COMMUNITY)
Admission: RE | Admit: 2019-09-28 | Discharge: 2019-09-28 | Disposition: A | Discharge status: Home / Self Care | Payer: Self-pay | Source: Ambulatory Visit | Attending: Family Medicine | Admitting: Family Medicine

## 2019-09-28 ENCOUNTER — Other Ambulatory Visit: Payer: Self-pay

## 2019-09-28 ENCOUNTER — Encounter: Payer: Self-pay | Admitting: Family Medicine

## 2019-09-28 ENCOUNTER — Ambulatory Visit (INDEPENDENT_AMBULATORY_CARE_PROVIDER_SITE_OTHER): Payer: Self-pay

## 2019-09-28 DIAGNOSIS — O3680X0 Pregnancy with inconclusive fetal viability, not applicable or unspecified: Secondary | ICD-10-CM | POA: Insufficient documentation

## 2019-09-28 DIAGNOSIS — Z712 Person consulting for explanation of examination or test findings: Secondary | ICD-10-CM

## 2019-09-28 NOTE — Progress Notes (Signed)
Pt here today for results following [place] for viability. Results reviewed with Earlie Server, NP who finds single-living IUP and recommends pt begin prenatal care.  Reviewed results and provider recommendation with patient. New dating given; pt is 7w today with EDD 05/16/20. Medications and allergies reviewed; list of medications safe to take during pregnancy given. Pt reports some nausea; encouraged pt to pick up phenergan rx that was already sent to pharmacy and to try eating smaller, frequent meals.   [place] office to provide proof of pregnancy letter and to schedule new OB appts.   Apolonio Schneiders RN 09/28/19

## 2019-10-01 NOTE — L&D Delivery Note (Signed)
[facility]N [facility] Delivery Note  Patricia Villanueva is a 29 y.o. G3P2002 s/p SVD at [redacted]w[redacted]d. She was admitted for active labor.   ROM: 0h 49m with clear fluid GBS Status: neg Maximum Maternal Temperature: 97.9  Labor Progress: Marland Kitchen Ms [place] was admitted in active labor at 5cm. Had an epidural placed after arriving to L&D when she was 7-8cm, and then progressed to SVD approx 4 mins after becoming completely dilated.  Delivery Date/Time: May 11, 2020 at 0415 Delivery: Called to room and patient had just delivered with Dr Conni Elliot present. No nuchal cord present. Infant with spontaneous cry, placed on mother's abdomen, dried and stimulated. Cord clamped x 2 after 1-minute delay, and cut by FOB. Cord blood drawn. Placenta delivered spontaneously with gentle cord traction. Fundus firm with massage and Pitocin. Labia, perineum, vagina, and cervix inspected and found to be intact.   Placenta: spont, intact Complications: none Lacerations: none EBL: 100cc Analgesia: epidural  Postpartum Planning [x]  message to sent to schedule follow-up    Infant: boy  APGARs 9/9  3459g (7lb 10oz)  11/9, CNM  05/11/2020 9:08 AM

## 2019-10-06 ENCOUNTER — Other Ambulatory Visit: Payer: Self-pay

## 2019-10-06 DIAGNOSIS — Z20822 Contact with and (suspected) exposure to covid-19: Secondary | ICD-10-CM | POA: Diagnosis not present

## 2019-10-08 ENCOUNTER — Other Ambulatory Visit: Payer: Self-pay

## 2019-10-08 ENCOUNTER — Ambulatory Visit (INDEPENDENT_AMBULATORY_CARE_PROVIDER_SITE_OTHER): Payer: Self-pay | Admitting: *Deleted

## 2019-10-08 DIAGNOSIS — U071 COVID-19: Secondary | ICD-10-CM

## 2019-10-08 DIAGNOSIS — Z3490 Encounter for supervision of normal pregnancy, unspecified, unspecified trimester: Secondary | ICD-10-CM | POA: Insufficient documentation

## 2019-10-08 DIAGNOSIS — Z8616 Personal history of COVID-19: Secondary | ICD-10-CM | POA: Insufficient documentation

## 2019-10-08 LAB — NOVEL CORONAVIRUS, NAA: SARS-CoV-2, NAA: DETECTED — AB

## 2019-10-08 NOTE — Progress Notes (Signed)
I connected with  Patricia Villanueva on 10/08/19 at  8:30 AM EST by telephone and verified that I am speaking with the correct person using two identifiers.   I discussed the limitations, risks, security and privacy concerns of performing an evaluation and management service by telephone and the availability of in person appointments. I also discussed with the patient that there may be a patient responsible charge related to this service. The patient expressed understanding and agreed to proceed. Explained I am completing her New OB Intake today. We discussed Her EDD and that it is based on  Early [place] due to unsure LMP. I reviewed her allergies, meds, OB History, Medical /Surgical history, and appropriate screenings. She has been tested for Covid 1/6 due to symptoms and exposure. We checked results and was +. Discussed quarantine at home and if severe symptoms such as SOB to go to hospital. Informed her I will put Covid Isolation at home guidelines in her checkout in MyChart. I also informed her of [facility] services if needed.  I explained I will send her the [employer] app- app sent to her while on phone.  I explained we will send a blood pressure cuff to [facility] that will fill that prescription  Once her medicaid is active. Explained  then we will have her take her blood pressure weekly and enter into the app. Explained she will have some visits in office and some virtually. She already has Sports coach. I reviewed her new ob  appointment date/ time with her , our location and to wear mask, no visitors.  I explained she will have a pelvic exam, ob bloodwork, hemoglobin a1C, cbg , genetic testing if desired,- she does want a panorama,  pap if needed. I scheduled an [place] at 19 weeks and gave her the appointment. She voices understanding.   [name],RN 10/08/2019  8:22 AM

## 2019-10-08 NOTE — Patient Instructions (Addendum)
Person Under Monitoring Name: Patricia Villanueva  Location: [address] [place] [address]   Infection Prevention Recommendations for Individuals Confirmed to have, or Being Evaluated for, 2019 Novel Coronavirus (COVID-19) Infection Who Receive Care at Home  Individuals who are confirmed to have, or are being evaluated for, COVID-19 should follow the prevention steps below until a healthcare provider or local or state health department says they can return to normal activities.  Stay home except to get medical care You should restrict activities outside your home, except for getting medical care. Do not go to work, school, or public areas, and do not use public transportation or taxis.  Call ahead before visiting your doctor Before your medical appointment, call the healthcare provider and tell them that you have, or are being evaluated for, COVID-19 infection. This will help the healthcare provider's office take steps to keep other people from getting infected. Ask your healthcare provider to call the local or state health department.  Monitor your symptoms Seek prompt medical attention if your illness is worsening (e.g., difficulty breathing). Before going to your medical appointment, call the healthcare provider and tell them that you have, or are being evaluated for, COVID-19 infection. Ask your healthcare provider to call the local or state health department.  Wear a facemask You should wear a facemask that covers your nose and mouth when you are in the same room with other people and when you visit a healthcare provider. People who live with or visit you should also wear a facemask while they are in the same room with you.  Separate yourself from other people in your home As much as possible, you should stay in a different room from other people in your home. Also, you should use a separate bathroom, if available.  Avoid sharing household items You should  not share dishes, drinking glasses, cups, eating utensils, towels, bedding, or other items with other people in your home. After using these items, you should wash them thoroughly with soap and water.  Cover your coughs and sneezes Cover your mouth and nose with a tissue when you cough or sneeze, or you can cough or sneeze into your sleeve. Throw used tissues in a lined trash can, and immediately wash your hands with soap and water for at least 20 seconds or use an alcohol-based hand rub.  Wash your [employer] your hands often and thoroughly with soap and water for at least 20 seconds. You can use an alcohol-based hand sanitizer if soap and water are not available and if your hands are not visibly dirty. Avoid touching your eyes, nose, and mouth with unwashed hands.   Prevention Steps for Caregivers and Household Members of Individuals Confirmed to have, or Being Evaluated for, COVID-19 Infection Being Cared for in the Home  If you live with, or provide care at home for, a person confirmed to have, or being evaluated for, COVID-19 infection please follow these guidelines to prevent infection:  Follow healthcare provider's instructions Make sure that you understand and can help the patient follow any healthcare provider instructions for all care.  Provide for the patient's basic needs You should help the patient with basic needs in the home and provide support for getting groceries, prescriptions, and other personal needs.  Monitor the patient's symptoms If they are getting sicker, call his or her medical provider and tell them that the patient has, or is being evaluated for, COVID-19 infection. This will help the  healthcare provider's office take steps to keep other people from getting infected. Ask the healthcare provider to call the local or state health department.  Limit the number of people who have contact with the patient  If possible, have only one caregiver for the  patient.  Other household members should stay in another home or place of residence. If this is not possible, they should stay  in another room, or be separated from the patient as much as possible. Use a separate bathroom, if available.  Restrict visitors who do not have an essential need to be in the home.  Keep older adults, very young children, and other sick people away from the patient Keep older adults, very young children, and those who have compromised immune systems or chronic health conditions away from the patient. This includes people with chronic heart, lung, or kidney conditions, diabetes, and cancer.  Ensure good ventilation Make sure that shared spaces in the home have good air flow, such as from an air conditioner or an opened window, weather permitting.  Wash your hands often  Wash your hands often and thoroughly with soap and water for at least 20 seconds. You can use an alcohol based hand sanitizer if soap and water are not available and if your hands are not visibly dirty.  Avoid touching your eyes, nose, and mouth with unwashed hands.  Use disposable paper towels to dry your hands. If not available, use dedicated cloth towels and replace them when they become wet.  Wear a facemask and gloves  Wear a disposable facemask at all times in the room and gloves when you touch or have contact with the patient's blood, body fluids, and/or secretions or excretions, such as sweat, saliva, sputum, nasal mucus, vomit, urine, or feces.  Ensure the mask fits over your nose and mouth tightly, and do not touch it during use.  Throw out disposable facemasks and gloves after using them. Do not reuse.  Wash your hands immediately after removing your facemask and gloves.  If your personal clothing becomes contaminated, carefully remove clothing and launder. Wash your hands after handling contaminated clothing.  Place all used disposable facemasks, gloves, and other waste in a lined  container before disposing them with other household waste.  Remove gloves and wash your hands immediately after handling these items.  Do not share dishes, glasses, or other household items with the patient  Avoid sharing household items. You should not share dishes, drinking glasses, cups, eating utensils, towels, bedding, or other items with a patient who is confirmed to have, or being evaluated for, COVID-19 infection.  After the person uses these items, you should wash them thoroughly with soap and water.  Wash laundry thoroughly  Immediately remove and wash clothes or bedding that have blood, body fluids, and/or secretions or excretions, such as sweat, saliva, sputum, nasal mucus, vomit, urine, or feces, on them.  Wear gloves when handling laundry from the patient.  Read and follow directions on labels of laundry or clothing items and detergent. In general, wash and dry with the warmest temperatures recommended on the label.  Clean all areas the individual has used often  Clean all touchable surfaces, such as counters, tabletops, doorknobs, bathroom fixtures, toilets, phones, keyboards, tablets, and bedside tables, every day. Also, clean any surfaces that may have blood, body fluids, and/or secretions or excretions on them.  Wear gloves when cleaning surfaces the patient has come in contact with.  Use a diluted bleach solution (e.g., dilute bleach  with 1 part bleach and 10 parts water) or a household disinfectant with a label that says EPA-registered for coronaviruses. To make a bleach solution at home, add 1 tablespoon of bleach to 1 quart (4 cups) of water. For a larger supply, add  cup of bleach to 1 gallon (16 cups) of water.  Read labels of cleaning products and follow recommendations provided on product labels. Labels contain instructions for safe and effective use of the cleaning product including precautions you should take when applying the product, such as wearing gloves or  eye protection and making sure you have good ventilation during use of the product.  Remove gloves and wash hands immediately after cleaning.  Monitor yourself for signs and symptoms of illness Caregivers and household members are considered close contacts, should monitor their health, and will be asked to limit movement outside of the home to the extent possible. Follow the monitoring steps for close contacts listed on the symptom monitoring form.   ? If you have additional questions, contact your local health department or call the epidemiologist on call at [phone] (available 24/7). ? This guidance is subject to change. For the most up-to-date guidance from [facility], please refer to their website: [email or web address].hu    At [facility] [employer], we work as an integrated team, providing care to address both physical and emotional health. Your medical provider may refer you to see our Behavioral Health Clinician [facility]) on the same day you see your medical provider, as availability permits.  Our [facility] is available to all patients, visits generally last between 20-30 minutes, but can be longer or shorter, depending on patient need. The [facility] offers help with stress management, coping with symptoms of depression and anxiety, major life changes , sleep issues, changing risky behavior, grief and loss, life stress, working on personal life goals, and  behavioral health issues, as these all affect your overall health and wellness.  The [facility] is NOT available for the following: court-ordered evaluations, specialty assessments (custody or disability), letters to employers, or obtaining certification for an emotional support animal. The [facility] does not provide long-term therapeutic services. You have the right to refuse integrated behavioral health services, or to reschedule to see the [facility] at a later date.  Exception: If you are having thoughts of  suicide, we require that you either see the [facility] for further assessment, or contract for safety with your medical provider prior to checking out.  Confidentiality exception: If it is suspected that a child or disabled adult is being abused or neglected, we are required by law to report that to either Child Protective Services or Adult Management consultant.  If you have a diagnosis of Bipolar affective disorder, Schizophrenia, or recurrent Major depressive disorder, we will recommend that you establish care with a psychiatrist, as these are lifelong, chronic conditions, and we want your overall emotional health and medications to be more closely monitored. If you anticipate needing extended maternity leave due to mental illness, it it recommended that you find a psychiatrist as soon as possible. Neither the medical provider, nor the [facility], can recommend an extended maternity leave for mental health issues. Your medical provider or [facility] may refer you to a therapist for ongoing, traditional therapy, or to a psychiatrist, for medication management, if it would benefit your overall health. Depending on your insurance, you may have a copay to see the [facility]. If you are uninsured, it is recommended that you apply for financial assistance. (Forms may be requested at the  front desk for in-person visits, via MyChart, or request a form during a virtual visit).  If you see the [facility] more than 6 times, you will have to complete a comprehensive clinical assessment interview with the [facility] to resume integrated services.  Any questions?

## 2019-10-09 ENCOUNTER — Telehealth: Payer: Self-pay | Admitting: Physician Assistant

## 2019-10-09 NOTE — Telephone Encounter (Signed)
Patient was contacted regarding positive covid 19 result. Pt's symptoms include loss of taste and smell . No shortness of breath. We went over CDC guidelines for quarantine and ER precautions. She has MyChart and her PCP is aware. She has been instructed to discuss positive result with her OB since she is currently pregnant.    Cline Crock PA-C  [facility] '

## 2019-10-11 LAB — MOLECULAR ANCILLARY ONLY
Chlamydia: NEGATIVE
Comment: NEGATIVE
Comment: NORMAL
Neisseria Gonorrhea: NEGATIVE

## 2019-10-12 NOTE — Progress Notes (Signed)
Patient seen and assessed by nursing staff during this encounter. I have reviewed the chart and agree with the documentation and plan.  Thressa Sheller, CNM 10/12/2019 8:49 AM

## 2019-10-26 ENCOUNTER — Encounter: Payer: Self-pay | Admitting: Advanced Practice Midwife

## 2019-11-01 ENCOUNTER — Other Ambulatory Visit (HOSPITAL_COMMUNITY)
Admission: RE | Admit: 2019-11-01 | Discharge: 2019-11-01 | Disposition: A | Discharge status: Home / Self Care | Payer: Medicaid Other | Source: Ambulatory Visit | Attending: Advanced Practice Midwife | Admitting: Advanced Practice Midwife

## 2019-11-01 ENCOUNTER — Ambulatory Visit (INDEPENDENT_AMBULATORY_CARE_PROVIDER_SITE_OTHER): Payer: Medicaid Other | Admitting: Nurse Practitioner

## 2019-11-01 ENCOUNTER — Other Ambulatory Visit: Payer: Self-pay

## 2019-11-01 ENCOUNTER — Encounter: Payer: Self-pay | Admitting: Nurse Practitioner

## 2019-11-01 VITALS — BP 113/64 | HR 98 | Wt 205.4 lb

## 2019-11-01 DIAGNOSIS — Z3491 Encounter for supervision of normal pregnancy, unspecified, first trimester: Secondary | ICD-10-CM | POA: Diagnosis not present

## 2019-11-01 DIAGNOSIS — Z3A11 11 weeks gestation of pregnancy: Secondary | ICD-10-CM | POA: Diagnosis not present

## 2019-11-01 DIAGNOSIS — Z8616 Personal history of COVID-19: Secondary | ICD-10-CM | POA: Diagnosis not present

## 2019-11-01 DIAGNOSIS — Z6829 Body mass index (BMI) 29.0-29.9, adult: Secondary | ICD-10-CM

## 2019-11-01 DIAGNOSIS — Z3481 Encounter for supervision of other normal pregnancy, first trimester: Secondary | ICD-10-CM

## 2019-11-01 MED ORDER — BLOOD PRESSURE MONITORING DEVI: 1.0000 | 0 refills | Status: DC

## 2019-11-01 MED ORDER — VITAFOL GUMMIES 3.33-0.333-34.8 MG PO CHEW: 3.0000 | CHEWABLE_TABLET | Freq: Every day | ORAL | 11 refills | Status: DC

## 2019-11-01 NOTE — Progress Notes (Signed)
Subjective:   Patricia Villanueva is a 29 y.o. G3P2002 at [redacted]w[redacted]d by early ultrasound being seen today for her first obstetrical visit.  Her obstetrical history is significant for history of irregular menses, history of having Covid on 10-02-19 now fully recovered.. Patient does intend to breast feed. Pregnancy history fully reviewed.  She is not sure about having other children - contraception options discussed and will consider what she might want to do.  Patient reports no complaints.  HISTORY: OB History  Gravida Para Term Preterm AB Living  3 2 2  0 0 2  SAB TAB Ectopic Multiple Live Births  0 0 0 0 2    # Outcome Date GA Lbr Len/2nd Weight Sex Delivery Anes PTL Lv  3 Current           2 Term 04/15/17 [redacted]w[redacted]d 03:17 / 00:05 7 lb 6.3 oz (3.354 kg) F Vag-Spont EPI  LIV     Birth Comments: wnl     Name: Cunnington,GIRL Ameris     Apgar1: 9  Apgar5: 9  1 Term 11/04/13 [redacted]w[redacted]d 10:02 / 00:44 6 lb 14.9 oz (3.144 kg) F Vag-Spont EPI  LIV     Birth Comments: Birth mark on back   concerns with baby abdomen on prenatal [place] smaller than expected     Name: Reth,GIRL Kassiah     Apgar1: 8  Apgar5: 9   Past Medical History:  Diagnosis Date  . Chlamydia   . Medical history non-contributory    Past Surgical History:  Procedure Laterality Date  . arm surgery     left arm surgery to repair a nerve  . arm surgery    . DENTAL SURGERY    . NERVE EXPLORATION  04/04/2012   Procedure: NERVE EXPLORATION;  Surgeon: Linna Hoff, MD;  Location: [place];  Service: Orthopedics;  Laterality: Left;  Left Arm Laceration with Nerve Repair   Family History  Problem Relation Age of Onset  . Hypertension Mother   . Alcohol abuse Neg Hx   . Arthritis Neg Hx   . Birth defects Neg Hx   . Asthma Neg Hx   . Cancer Neg Hx   . COPD Neg Hx   . Diabetes Neg Hx   . Depression Neg Hx   . Drug abuse Neg Hx   . Early death Neg Hx   . Hearing loss Neg Hx   . Heart disease Neg Hx   . Hyperlipidemia Neg Hx   . Kidney disease Neg  Hx   . Learning disabilities Neg Hx   . Mental illness Neg Hx   . Mental retardation Neg Hx   . Miscarriages / Stillbirths Neg Hx   . Stroke Neg Hx   . Vision loss Neg Hx    Social History   Tobacco Use  . Smoking status: Never Smoker  . Smokeless tobacco: Never Used  Substance Use Topics  . Alcohol use: Not Currently  . Drug use: Not Currently    Types: Marijuana   No Known Allergies Current Outpatient Medications on File Prior to Visit  Medication Sig Dispense Refill  . promethazine (PHENERGAN) 25 MG tablet Take 1 tablet (25 mg total) by mouth every 6 (six) hours as needed for nausea or vomiting. 30 tablet 0  . Prenatal Vit-Fe Fumarate-FA (PRENATAL MULTIVITAMIN) TABS tablet Take 1 tablet by mouth daily at 12 noon.     No current facility-administered medications on file prior to visit.  Exam   Vitals:   11/01/19 1400  BP: 113/64  Pulse: 98  Weight: 205 lb 6.4 oz (93.2 kg)   Fetal Heart Rate (bpm): 159  Uterus:  Fundal Height: 12 cm  Pelvic Exam: Perineum: no hemorrhoids, normal perineum   Vulva: normal external genitalia, no lesions   Vagina:  normal mucosa, normal discharge   Cervix: no lesions and normal, pap smear done.    Adnexa: normal adnexa and no mass, fullness, tenderness   Bony Pelvis: average  System: General: well-developed, well-nourished female in no acute distress   Breast:  normal appearance, no masses or tenderness   Skin: normal coloration and turgor, no rashes   Neurologic: oriented, normal, negative, normal mood   Extremities: normal strength, tone, and muscle mass, ROM of all joints is normal   HEENT extraocular movement intact and sclera clear, anicteric   Mouth/Teeth  deferred   Neck supple and no masses, normal thyroid   Cardiovascular: regular rate and rhythm   Respiratory:  no respiratory distress, normal breath sounds   Abdomen: soft, non-tender; no masses,  no organomegaly     Assessment:   Pregnancy: [id number] Patient Active  Problem List   Diagnosis Date Noted  . Rubella non-immune status, antepartum 11/02/2019  . Supervision of low-risk pregnancy 10/08/2019  . History of COVID-19 10/08/2019  . History of irregular menstrual cycles 02/13/2013     Plan:  1. Encounter for supervision of low-risk pregnancy in first trimester Doing well Reviewed baby scripts and MyChart  - Culture, OB Urine - Genetic Screening - Obstetric Panel, Including HIV - Hemoglobin A1c - Cytology - PAP  2. BMI 29.0-29.9,adult Advised weight gain of 20 pounds ideally in this pregnancy Reviewed common sources causing quick Weight gain. Drink at least 8 8-oz glasses of water every day.  3. History of COVID-19 Mild symptoms that have resolved at this time.     Initial labs drawn. Continue prenatal vitamins. Genetic Screening discussed, NIPS: ordered. Ultrasound discussed; fetal anatomic survey: ordered. Problem list reviewed and updated. The nature of [place] [facility] with multiple MDs and other Advanced Practice Providers was explained to patient; also emphasized that residents, students are part of our team. Routine obstetric precautions reviewed. Return in about 4 weeks (around 11/29/2019) for MyChart ROB.  Total face-to-face time with patient: 40 minutes.  Over 50% of encounter was spent on counseling and coordination of care.     Nolene Bernheim, FNP Family Nurse Practitioner, [facility] for [employer], [facility] 11-01-19 5:21 PM

## 2019-11-01 NOTE — Progress Notes (Signed)
Medicaid Home Form completed-11/01/19

## 2019-11-01 NOTE — Patient Instructions (Signed)
Back Pain in Pregnancy Back pain during pregnancy is common. Back pain may be caused by several factors that are related to changes during your pregnancy. Follow these instructions at home: Managing pain, stiffness, and swelling      If directed, for sudden (acute) back pain, put ice on the painful area. ? Put ice in a plastic bag. ? Place a towel between your skin and the bag. ? Leave the ice on for 20 minutes, 2-3 times per day.  If directed, apply heat to the affected area before you exercise. Use the heat source that your health care provider recommends, such as a moist heat pack or a heating pad. ? Place a towel between your skin and the heat source. ? Leave the heat on for 20-30 minutes. ? Remove the heat if your skin turns bright red. This is especially important if you are unable to feel pain, heat, or cold. You may have a greater risk of getting burned.  If directed, massage the affected area. Activity  Exercise as told by your health care provider. Gentle exercise is the best way to prevent or manage back pain.  Listen to your body when lifting. If lifting hurts, ask for help or bend your knees. This uses your leg muscles instead of your back muscles.  Squat down when picking up something from the floor. Do not bend over.  Only use bed rest for short periods as told by your health care provider. Bed rest should only be used for the most severe episodes of back pain. Standing, sitting, and lying down  Do not stand in one place for long periods of time.  Use good posture when sitting. Make sure your head rests over your shoulders and is not hanging forward. Use a pillow on your lower back if necessary.  Try sleeping on your side, preferably the left side, with a pregnancy support pillow or 1-2 regular pillows between your legs. ? If you have back pain after a night's rest, your bed may be too soft. ? A firm mattress may provide more support for your back during  pregnancy. General instructions  Do not wear high heels.  Eat a healthy diet. Try to gain weight within your health care provider's recommendations.  Use a maternity girdle, elastic sling, or back brace as told by your health care provider.  Take over-the-counter and prescription medicines only as told by your health care provider.  Work with a physical therapist or massage therapist to find ways to manage back pain. Acupuncture or massage therapy may be helpful.  Keep all follow-up visits as told by your health care provider. This is important. Contact a health care provider if:  Your back pain interferes with your daily activities.  You have increasing pain in other parts of your body. Get help right away if:  You develop numbness, tingling, weakness, or problems with the use of your arms or legs.  You develop severe back pain that is not controlled with medicine.  You have a change in bowel or bladder control.  You develop shortness of breath, dizziness, or you faint.  You develop nausea, vomiting, or sweating.  You have back pain that is a rhythmic, cramping pain similar to labor pains. Labor pain is usually 1-2 minutes apart, lasts for about 1 minute, and involves a bearing down feeling or pressure in your pelvis.  You have back pain and your water breaks or you have vaginal bleeding.  You have back pain or numbness  that travels down your leg.  Your back pain developed after you fell.  You develop pain on one side of your back.  You see blood in your urine.  You develop skin blisters in the area of your back pain. Summary  Back pain may be caused by several factors that are related to changes during your pregnancy.  Follow instructions as told by your health care provider for managing pain, stiffness, and swelling.  Exercise as told by your health care provider. Gentle exercise is the best way to prevent or manage back pain.  Take over-the-counter and  prescription medicines only as told by your health care provider.  Keep all follow-up visits as told by your health care provider. This is important. This information is not intended to replace advice given to you by your health care provider. Make sure you discuss any questions you have with your health care provider. Document Revised: 01/05/2019 Document Reviewed: 03/04/2018 Elsevier Patient Education  [employer].  Back Exercises These exercises help to make your trunk and back strong. They also help to keep the lower back flexible. Doing these exercises can help to prevent back pain or lessen existing pain.  If you have back pain, try to do these exercises 2-3 times each day or as told by your doctor.  As you get better, do the exercises once each day. Repeat the exercises more often as told by your doctor.  To stop back pain from coming back, do the exercises once each day, or as told by your doctor. Exercises Single knee to chest Do these steps 3-5 times in a row for each leg: 1. Lie on your back on a firm bed or the floor with your legs stretched out. 2. Bring one knee to your chest. 3. Grab your knee or thigh with both hands and hold them it in place. 4. Pull on your knee until you feel a gentle stretch in your lower back or buttocks. 5. Keep doing the stretch for 10-30 seconds. 6. Slowly let go of your leg and straighten it. Pelvic tilt Do these steps 5-10 times in a row: 1. Lie on your back on a firm bed or the floor with your legs stretched out. 2. Bend your knees so they point up to the ceiling. Your feet should be flat on the floor. 3. Tighten your lower belly (abdomen) muscles to press your lower back against the floor. This will make your tailbone point up to the ceiling instead of pointing down to your feet or the floor. 4. Stay in this position for 5-10 seconds while you gently tighten your muscles and breathe evenly. Cat-cow Do these steps until your lower back  bends more easily: 1. Get on your hands and knees on a firm surface. Keep your hands under your shoulders, and keep your knees under your hips. You may put padding under your knees. 2. Let your head hang down toward your chest. Tighten (contract) the muscles in your belly. Point your tailbone toward the floor so your lower back becomes rounded like the back of a cat. 3. Stay in this position for 5 seconds. 4. Slowly lift your head. Let the muscles of your belly relax. Point your tailbone up toward the ceiling so your back forms a sagging arch like the back of a cow. 5. Stay in this position for 5 seconds.  Press-ups Do these steps 5-10 times in a row: 1. Lie on your belly (face-down) on the floor. 2. Place  your hands near your head, about shoulder-width apart. 3. While you keep your back relaxed and keep your hips on the floor, slowly straighten your arms to raise the top half of your body and lift your shoulders. Do not use your back muscles. You may change where you place your hands in order to make yourself more comfortable. 4. Stay in this position for 5 seconds. 5. Slowly return to lying flat on the floor.  Bridges Do these steps 10 times in a row: 1. Lie on your back on a firm surface. 2. Bend your knees so they point up to the ceiling. Your feet should be flat on the floor. Your arms should be flat at your sides, next to your body. 3. Tighten your butt muscles and lift your butt off the floor until your waist is almost as high as your knees. If you do not feel the muscles working in your butt and the back of your thighs, slide your feet 1-2 inches farther away from your butt. 4. Stay in this position for 3-5 seconds. 5. Slowly lower your butt to the floor, and let your butt muscles relax. If this exercise is too easy, try doing it with your arms crossed over your chest. Belly crunches Do these steps 5-10 times in a row: 1. Lie on your back on a firm bed or the floor with your legs  stretched out. 2. Bend your knees so they point up to the ceiling. Your feet should be flat on the floor. 3. Cross your arms over your chest. 4. Tip your chin a little bit toward your chest but do not bend your neck. 5. Tighten your belly muscles and slowly raise your chest just enough to lift your shoulder blades a tiny bit off of the floor. Avoid raising your body higher than that, because it can put too much stress on your low back. 6. Slowly lower your chest and your head to the floor. Back lifts Do these steps 5-10 times in a row: 1. Lie on your belly (face-down) with your arms at your sides, and rest your forehead on the floor. 2. Tighten the muscles in your legs and your butt. 3. Slowly lift your chest off of the floor while you keep your hips on the floor. Keep the back of your head in line with the curve in your back. Look at the floor while you do this. 4. Stay in this position for 3-5 seconds. 5. Slowly lower your chest and your face to the floor. Contact a doctor if:  Your back pain gets a lot worse when you do an exercise.  Your back pain does not get better 2 hours after you exercise. If you have any of these problems, stop doing the exercises. Do not do them again unless your doctor says it is okay. Get help right away if:  You have sudden, very bad back pain. If this happens, stop doing the exercises. Do not do them again unless your doctor says it is okay. This information is not intended to replace advice given to you by your health care provider. Make sure you discuss any questions you have with your health care provider. Document Revised: 06/11/2018 Document Reviewed: 06/11/2018 Elsevier Patient Education  2020 [employer].

## 2019-11-02 ENCOUNTER — Encounter: Payer: Self-pay | Admitting: Nurse Practitioner

## 2019-11-02 DIAGNOSIS — Z3400 Encounter for supervision of normal first pregnancy, unspecified trimester: Secondary | ICD-10-CM | POA: Diagnosis not present

## 2019-11-02 DIAGNOSIS — Z2839 Other underimmunization status: Secondary | ICD-10-CM | POA: Insufficient documentation

## 2019-11-02 DIAGNOSIS — Z283 Underimmunization status: Secondary | ICD-10-CM | POA: Insufficient documentation

## 2019-11-02 LAB — OBSTETRIC PANEL, INCLUDING HIV
Antibody Screen: NEGATIVE
Basophils Absolute: 0 10*3/uL (ref 0.0–0.2)
Basos: 0 %
EOS (ABSOLUTE): 0.2 10*3/uL (ref 0.0–0.4)
Eos: 3 %
HIV Screen 4th Generation wRfx: NONREACTIVE
Hematocrit: 34.7 % (ref 34.0–46.6)
Hemoglobin: 11.4 g/dL (ref 11.1–15.9)
Hepatitis B Surface Ag: NEGATIVE
Immature Grans (Abs): 0 10*3/uL (ref 0.0–0.1)
Immature Granulocytes: 0 %
Lymphocytes Absolute: 1.8 10*3/uL (ref 0.7–3.1)
Lymphs: 26 %
MCH: 27.7 pg (ref 26.6–33.0)
MCHC: 32.9 g/dL (ref 31.5–35.7)
MCV: 84 fL (ref 79–97)
Monocytes Absolute: 0.4 10*3/uL (ref 0.1–0.9)
Monocytes: 6 %
Neutrophils Absolute: 4.6 10*3/uL (ref 1.4–7.0)
Neutrophils: 65 %
Platelets: 191 10*3/uL (ref 150–450)
RBC: 4.12 x10E6/uL (ref 3.77–5.28)
RDW: 12.4 % (ref 11.7–15.4)
RPR Ser Ql: NONREACTIVE
Rh Factor: POSITIVE
Rubella Antibodies, IGG: <0.9 index — ABNORMAL LOW (ref >0.99)
WBC: 7.1 10*3/uL (ref 3.4–10.8)

## 2019-11-02 LAB — HEMOGLOBIN A1C
Est. average glucose Bld gHb Est-mCnc: 105 mg/dL
Hgb A1c MFr Bld: 5.3 % (ref 4.8–5.6)

## 2019-11-03 LAB — CYTOLOGY - PAP
Chlamydia: NEGATIVE
Comment: NEGATIVE
Comment: NORMAL
Diagnosis: NEGATIVE
Neisseria Gonorrhea: NEGATIVE

## 2019-11-03 LAB — URINE CULTURE, OB REFLEX

## 2019-11-03 LAB — CULTURE, OB URINE

## 2019-11-04 ENCOUNTER — Encounter: Payer: Self-pay | Admitting: *Deleted

## 2019-11-09 ENCOUNTER — Encounter: Payer: Self-pay | Admitting: General Practice

## 2019-11-11 ENCOUNTER — Encounter: Payer: Self-pay | Admitting: Nurse Practitioner

## 2019-11-11 DIAGNOSIS — D563 Thalassemia minor: Secondary | ICD-10-CM | POA: Insufficient documentation

## 2019-11-12 ENCOUNTER — Telehealth: Payer: Self-pay

## 2019-11-12 NOTE — Telephone Encounter (Signed)
Called pt to ask if any one from [employer] has called to set up genetic counseling session & she stated no, so gave her the toll free # to schedule. Pt verbalized understanding.

## 2019-11-16 ENCOUNTER — Telehealth: Payer: Self-pay

## 2019-11-16 NOTE — Telephone Encounter (Signed)
Called Pt to advise of BP Cuff arriving, no answer, left VM with hours of operation for pickup.

## 2019-11-22 ENCOUNTER — Encounter: Payer: Self-pay | Admitting: *Deleted

## 2019-11-29 ENCOUNTER — Other Ambulatory Visit: Payer: Self-pay

## 2019-11-29 ENCOUNTER — Telehealth (INDEPENDENT_AMBULATORY_CARE_PROVIDER_SITE_OTHER): Payer: Medicaid Other | Admitting: Advanced Practice Midwife

## 2019-11-29 DIAGNOSIS — Z3A15 15 weeks gestation of pregnancy: Secondary | ICD-10-CM | POA: Diagnosis not present

## 2019-11-29 DIAGNOSIS — D563 Thalassemia minor: Secondary | ICD-10-CM

## 2019-11-29 DIAGNOSIS — Z8616 Personal history of COVID-19: Secondary | ICD-10-CM

## 2019-11-29 DIAGNOSIS — O26892 Other specified pregnancy related conditions, second trimester: Secondary | ICD-10-CM

## 2019-11-29 DIAGNOSIS — Z3492 Encounter for supervision of normal pregnancy, unspecified, second trimester: Secondary | ICD-10-CM

## 2019-11-29 NOTE — Progress Notes (Signed)
   TELEHEALTH VIRTUAL OBSTETRICS VISIT ENCOUNTER NOTE  I connected with Franki Cabot on 11/29/19 at  8:15 AM EST by telephone at home and verified that I am speaking with the correct person using two identifiers.   I discussed the limitations, risks, security and privacy concerns of performing an evaluation and management service by telephone and the availability of in person appointments. I also discussed with the patient that there may be a patient responsible charge related to this service. The patient expressed understanding and agreed to proceed.  Subjective:  Patricia Villanueva is a 29 y.o. G3P2002 at [redacted]w[redacted]d being followed for ongoing prenatal care.  She is currently monitored for the following issues for this low-risk pregnancy and has History of irregular menstrual cycles; Supervision of low-risk pregnancy; History of COVID-19; Rubella non-immune status, antepartum; and Alpha thalassemia silent carrier on their problem list.  Patient reports occasional "tightening" across the top of her abdomen. She endorses drinking primarily fruit juice but is trying to increase her water intake. Reports fetal movement. Denies any middle or lower abdominal contractions, bleeding or leaking of fluid.   The following portions of the patient's history were reviewed and updated as appropriate: allergies, current medications, past family history, past medical history, past social history, past surgical history and problem list.   Objective:   General:  Alert, oriented and cooperative.   Mental Status: Normal mood and affect perceived. Normal judgment and thought content.  Rest of physical exam deferred due to type of encounter  Assessment and Plan:  Pregnancy: G3P2002 at [redacted]w[redacted]d 1. Encounter for supervision of low-risk pregnancy in second trimester - Patient's bp cuff at front desk. She plans to pick it up this week - Restated goal of weekly bp reading, call for >/= 140/90  2. History of COVID-19   3. Alpha  thalassemia silent carrier --Previously declined Dentist, declined again today --Seeing MFM for repeat scan 03/25  Preterm labor symptoms and general obstetric precautions including but not limited to vaginal bleeding, contractions, leaking of fluid and fetal movement were reviewed in detail with the patient.  I discussed the assessment and treatment plan with the patient. The patient was provided an opportunity to ask questions and all were answered. The patient agreed with the plan and demonstrated an understanding of the instructions. The patient was advised to call back or seek an in-person office evaluation/go to [facility] at [facility] for any urgent or concerning symptoms. Please refer to After Visit Summary for other counseling recommendations.   I provided seven minutes of non-face-to-face time during this encounter.  Return in about 4 weeks (around 12/27/2019) for Virtual, any provider.  Future Appointments  Date Time Provider Department Center  12/23/2019  8:15 AM [facility] [place] 4 [facility]    Calvert Cantor, CNM [facility] [employer], [facility]

## 2019-11-29 NOTE — Progress Notes (Signed)
8:15A- Called pt to start My Chart visit, no answer & mail box is full.Will retry in 10 to 15 minutes.   I connected with  Patricia Villanueva on 11/29/19 at  8:15 AM EST by telephone and verified that I am speaking with the correct person using two identifiers.   I discussed the limitations, risks, security and privacy concerns of performing an evaluation and management service by telephone and the availability of in person appointments. I also discussed with the patient that there may be a patient responsible charge related to this service. The patient expressed understanding and agreed to proceed.  CYNDE MENARD, CMA 11/29/2019  8:29 AM

## 2019-11-29 NOTE — Patient Instructions (Signed)

## 2019-12-06 ENCOUNTER — Telehealth: Payer: Self-pay | Admitting: Obstetrics and Gynecology

## 2019-12-06 NOTE — Telephone Encounter (Signed)
Called the patient to inform of the upcoming appointment as a virtual visit. The contact number is disconnected. The patient is being sent a message via mychart with the appointment information and to please contact our office if the date and time is inconvenient.

## 2019-12-07 ENCOUNTER — Encounter: Payer: Medicaid Other | Admitting: Family Medicine

## 2019-12-10 ENCOUNTER — Other Ambulatory Visit: Payer: Self-pay

## 2019-12-10 ENCOUNTER — Ambulatory Visit (INDEPENDENT_AMBULATORY_CARE_PROVIDER_SITE_OTHER): Payer: Medicaid Other | Admitting: Obstetrics and Gynecology

## 2019-12-10 VITALS — BP 121/79 | HR 73 | Wt 209.0 lb

## 2019-12-10 DIAGNOSIS — Z3492 Encounter for supervision of normal pregnancy, unspecified, second trimester: Secondary | ICD-10-CM | POA: Diagnosis not present

## 2019-12-10 DIAGNOSIS — Z3A17 17 weeks gestation of pregnancy: Secondary | ICD-10-CM

## 2019-12-10 NOTE — Progress Notes (Signed)
Prenatal Visit Note Date: 12/10/2019 Clinic: [facility]  Subjective:  Patricia Villanueva is a 29 y.o. G3P2002 at 104w3d being seen today for ongoing prenatal care.  She is currently monitored for the following issues for this low-risk pregnancy and has History of irregular menstrual cycles; Supervision of low-risk pregnancy; History of COVID-19; Rubella non-immune status, antepartum; and Alpha thalassemia silent carrier on their problem list.  Patient reports no complaints.   Contractions: Not present. Vag. Bleeding: None.  Movement: Absent. Denies leaking of fluid.   The following portions of the patient's history were reviewed and updated as appropriate: allergies, current medications, past family history, past medical history, past social history, past surgical history and problem list. Problem list updated.  Objective:   Vitals:   12/10/19 0848  BP: 121/79  Pulse: 73  Weight: 209 lb (94.8 kg)    Fetal Status: Fetal Heart Rate (bpm): 149   Movement: Absent     General:  Alert, oriented and cooperative. Patient is in no acute distress.  Skin: Skin is warm and dry. No rash noted.   Cardiovascular: Normal heart rate noted  Respiratory: Normal respiratory effort, no problems with respiration noted  Abdomen: Soft, gravid, appropriate for gestational age. Pain/Pressure: Absent     Pelvic:  Cervical exam deferred        Extremities: Normal range of motion.  Edema: None  Mental Status: Normal mood and affect. Normal behavior. Normal judgment and thought content.   Urinalysis:      Assessment and Plan:  Pregnancy: G3P2002 at [redacted]w[redacted]d  1. Encounter for supervision of low-risk pregnancy in second trimester Routine care Anatomy u/s already scheduled Weight goals discussed - AFP, Serum, Open Spina Bifida  Preterm labor symptoms and general obstetric precautions including but not limited to vaginal bleeding, contractions, leaking of fluid and fetal movement were reviewed  in detail with the patient. Please refer to After Visit Summary for other counseling recommendations.  Return for 4-5 weeks virtual low risk.   [place] Bing, MD

## 2019-12-12 LAB — AFP, SERUM, OPEN SPINA BIFIDA
AFP MoM: 1.1
AFP Value: 39 ng/mL
Gest. Age on Collection Date: 17.3 weeks
Maternal Age At EDD: 27.7 yr
OSBR Risk 1 IN: 10000
Test Results:: NEGATIVE
Weight: 209 [lb_av]

## 2019-12-23 ENCOUNTER — Ambulatory Visit (HOSPITAL_COMMUNITY)
Admission: RE | Admit: 2019-12-23 | Payer: Medicaid Other | Source: Ambulatory Visit | Attending: Advanced Practice Midwife | Admitting: Advanced Practice Midwife

## 2019-12-29 ENCOUNTER — Other Ambulatory Visit: Payer: Self-pay

## 2019-12-29 ENCOUNTER — Ambulatory Visit (HOSPITAL_COMMUNITY)
Admission: RE | Admit: 2019-12-29 | Discharge: 2019-12-29 | Disposition: A | Discharge status: Home / Self Care | Payer: Medicaid Other | Source: Ambulatory Visit | Attending: Obstetrics and Gynecology | Admitting: Obstetrics and Gynecology

## 2019-12-29 DIAGNOSIS — Z3A20 20 weeks gestation of pregnancy: Secondary | ICD-10-CM | POA: Diagnosis not present

## 2019-12-29 DIAGNOSIS — O99212 Obesity complicating pregnancy, second trimester: Secondary | ICD-10-CM | POA: Diagnosis not present

## 2019-12-29 DIAGNOSIS — Z363 Encounter for antenatal screening for malformations: Secondary | ICD-10-CM | POA: Diagnosis not present

## 2019-12-29 DIAGNOSIS — Z3490 Encounter for supervision of normal pregnancy, unspecified, unspecified trimester: Secondary | ICD-10-CM | POA: Diagnosis not present

## 2019-12-29 DIAGNOSIS — Z148 Genetic carrier of other disease: Secondary | ICD-10-CM

## 2019-12-30 ENCOUNTER — Other Ambulatory Visit (HOSPITAL_COMMUNITY): Payer: Self-pay | Admitting: *Deleted

## 2019-12-30 DIAGNOSIS — D563 Thalassemia minor: Secondary | ICD-10-CM

## 2020-01-07 ENCOUNTER — Encounter: Payer: Self-pay | Admitting: Medical

## 2020-01-07 ENCOUNTER — Telehealth (INDEPENDENT_AMBULATORY_CARE_PROVIDER_SITE_OTHER): Payer: Medicaid Other | Admitting: Medical

## 2020-01-07 DIAGNOSIS — Z5329 Procedure and treatment not carried out because of patient's decision for other reasons: Secondary | ICD-10-CM

## 2020-01-07 NOTE — Patient Instructions (Signed)
Please reschedule your appointment  

## 2020-01-07 NOTE — Progress Notes (Signed)
@  1115am lvm with appointment information and advised ill call back or for her to log in  @1125am  went to VM @1130am  no answer

## 2020-01-11 ENCOUNTER — Telehealth (INDEPENDENT_AMBULATORY_CARE_PROVIDER_SITE_OTHER): Payer: Medicaid Other | Admitting: Nurse Practitioner

## 2020-01-11 ENCOUNTER — Telehealth: Payer: Self-pay | Admitting: Nurse Practitioner

## 2020-01-11 DIAGNOSIS — Z5329 Procedure and treatment not carried out because of patient's decision for other reasons: Secondary | ICD-10-CM

## 2020-01-11 DIAGNOSIS — Z91199 Patient's noncompliance with other medical treatment and regimen due to unspecified reason: Secondary | ICD-10-CM

## 2020-01-11 NOTE — Telephone Encounter (Signed)
Attempted to contact patient in regards to her missed ob appointment. No answer, left voicemail with new appointment information ( 4/14@ 11:15-virtual).

## 2020-01-11 NOTE — Progress Notes (Signed)
8:18a- Called pt for My Chart visit, no answer, left VM that I will call her back in 10 to 15 minutes.    8:32a-2nd attempt, still no answer, left VM that will need to reschedule appointment.

## 2020-01-12 ENCOUNTER — Encounter: Payer: Self-pay | Admitting: Certified Nurse Midwife

## 2020-01-12 ENCOUNTER — Telehealth (INDEPENDENT_AMBULATORY_CARE_PROVIDER_SITE_OTHER): Payer: Medicaid Other | Admitting: Certified Nurse Midwife

## 2020-01-12 ENCOUNTER — Ambulatory Visit (HOSPITAL_COMMUNITY): Payer: Medicaid Other | Attending: Obstetrics and Gynecology

## 2020-01-12 ENCOUNTER — Encounter (HOSPITAL_COMMUNITY): Payer: Self-pay

## 2020-01-12 VITALS — BP 114/66 | HR 85

## 2020-01-12 DIAGNOSIS — Z2839 Other underimmunization status: Secondary | ICD-10-CM

## 2020-01-12 DIAGNOSIS — Z3492 Encounter for supervision of normal pregnancy, unspecified, second trimester: Secondary | ICD-10-CM

## 2020-01-12 DIAGNOSIS — Z283 Underimmunization status: Secondary | ICD-10-CM

## 2020-01-12 DIAGNOSIS — O99891 Other specified diseases and conditions complicating pregnancy: Secondary | ICD-10-CM

## 2020-01-12 DIAGNOSIS — Z3A22 22 weeks gestation of pregnancy: Secondary | ICD-10-CM

## 2020-01-12 NOTE — Progress Notes (Signed)
I connected with  Patricia Villanueva on 01/12/20 at 11:15 AM EDT by telephone and verified that I am speaking with the correct person using two identifiers.   I discussed the limitations, risks, security and privacy concerns of performing an evaluation and management service by telephone and the availability of in person appointments. I also discussed with the patient that there may be a patient responsible charge related to this service. The patient expressed understanding and agreed to proceed.  Patricia Villanueva, CMA 01/12/2020  10:57 AM

## 2020-01-12 NOTE — Progress Notes (Signed)
OBSTETRICS PRENATAL VIRTUAL VISIT ENCOUNTER NOTE  Provider location: [facility] [place] [facility] [place]   I connected with Patricia Villanueva on 01/12/20 at 11:10 AM EDT by MyChart Video Encounter at home and verified that I am speaking with the correct person using two identifiers.   I discussed the limitations, risks, security and privacy concerns of performing an evaluation and management service virtually and the availability of in person appointments. I also discussed with the patient that there may be a patient responsible charge related to this service. The patient expressed understanding and agreed to proceed. Subjective:  Patricia Villanueva is a 29 y.o. G3P2002 at 62w1dbeing seen today for ongoing prenatal care.  She is currently monitored for the following issues for this low-risk pregnancy and has History of irregular menstrual cycles; Supervision of low-risk pregnancy; History of COVID-19; Rubella non-immune status, antepartum; and Alpha thalassemia silent carrier on their problem list.  Patient reports no complaints.  Contractions: Not present. Vag. Bleeding: None.  Movement: Present. Denies any leaking of fluid.   The following portions of the patient's history were reviewed and updated as appropriate: allergies, current medications, past family history, past medical history, past social history, past surgical history and problem list.   Objective:   Vitals:   01/12/20 1102  BP: 114/66  Pulse: 85    Fetal Status:     Movement: Present     General:  Alert, oriented and cooperative. Patient is in no acute distress.  Respiratory: Normal respiratory effort, no problems with respiration noted  Mental Status: Normal mood and affect. Normal behavior. Normal judgment and thought content.  Rest of physical exam deferred due to type of encounter  Imaging: UKoreaMFM OB DETAIL +14 WK  Result Date: 12/29/2019 ----------------------------------------------------------------------  OBSTETRICS  REPORT                       (Signed Final 12/29/2019 04:16 pm) ---------------------------------------------------------------------- Patient Info  ID #:       [id number]                         D.O.B.:  [birth year](28 yrs)  Name:       LOdetta Villanueva                   Visit Date: 12/29/2019 03:05 pm ---------------------------------------------------------------------- Performed By  Performed By:     MVianne BullsTester BS,       Ref. Address:     [address]                                                             [address]  Attending:        RTama HighMD        [facility]  Referred By:      [facility] ---------------------------------------------------------------------- Orders   #  Description                          Code         Ordered By   1  [place] MFM OB DETAIL +14 WK              76811.01     HEATHER HOGAN  ----------------------------------------------------------------------   #  Order #                    Accession #                 Episode #   1  [id number]                  [id number]  ---------------------------------------------------------------------- Indications   [redacted] weeks gestation of pregnancy                Z3A.20   Encounter for antenatal screening for          Z36.3   malformations   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 Cord Insertion:       Visualized, central  Amniotic Fluid  AFI FV:      Within normal limits                              Largest Pocket(cm)                              6.98 ---------------------------------------------------------------------- Biometry  BPD:      48.6  mm     G. Age:  20w 5d         73  %    CI:        74.66   %    70 - 86                                                          FL/HC:      19.9   %    16.8 - 19.8  HC:  178.5  mm     G. Age:  20w 2d         50  %    HC/AC:      1.11        1.09 - 1.39  AC:      160.8  mm     G. Age:  21w 1d         77  %    FL/BPD:     73.0   %  FL:       35.5  mm     G. Age:  21w 2d         79  %    FL/AC:      22.1   %    20 - 24  HUM:        34  mm     G. Age:  21w 4d         90  %  CER:      22.1  mm     G. Age:  21w 0d         64  %  NFT:       5.3  mm  LV:        7.9  mm  CM:        4.6  mm  Est. FW:     399  gm    0 lb 14 oz      91  % ---------------------------------------------------------------------- OB History  Gravidity:    3         Term:   2        Prem:   0        SAB:   0  TOP:          0       Ectopic:  0        Living: 2 ---------------------------------------------------------------------- Gestational Age  U/S Today:     20w 6d                                        EDD:   05/11/20  Best:          20w 1d     Det. By:  Previous Ultrasound      EDD:   05/16/20                                      (09/28/19) ---------------------------------------------------------------------- Anatomy  Cranium:               Appears normal         LVOT:                   Appears normal  Cavum:                 Appears normal         Aortic Arch:            Appears normal  Ventricles:            Appears normal         Ductal Arch:            Appears normal  Choroid Plexus:        Appears normal  Diaphragm:              Appears normal  Cerebellum:            Appears normal         Stomach:                Appears normal, left                                                                         sided  Posterior Fossa:       Appears normal         Abdomen:                Appears normal  Nuchal Fold:           Appears normal         Abdominal Wall:         Appears nml (cord                                                                        insert, abd wall)  Face:                  Appears normal         Cord Vessels:           Appears normal (3                         (orbits and profile)                           vessel cord)  Lips:                  Appears normal         Kidneys:                Appear normal  Palate:                Appears normal         Bladder:                Appears normal  Thoracic:              Appears normal         Spine:                  Appears normal  Heart:                 Appears normal         Upper Extremities:      Appears normal                         (4CH, axis, and  situs)  RVOT:                  Appears normal         Lower Extremities:      Appears normal  Other:  Female gender. Heels and Left  5th digit visualized. Open hands          visualized. Nasal bone visualized. ---------------------------------------------------------------------- Cervix Uterus Adnexa  Cervix  Length:           2.97  cm.  Normal appearance by transabdominal scan.  Uterus  No abnormality visualized.  Left Ovary  Within normal limits. No adnexal mass visualized.  Right Ovary  Within normal limits. No adnexal mass visualized.  Cul De Sac  No free fluid seen.  Adnexa  No abnormality visualized. ---------------------------------------------------------------------- Impression  We performed fetal anatomy scan. No makers of  aneuploidies or fetal structural defects are seen. Fetal  biometry is consistent with her previously-established dates.  Amniotic fluid is normal and good fetal activity is seen.  Patient understands the limitations of ultrasound in detecting  fetal anomalies.  MSAFP screening showed low risk for open-neural tube  defects.  On cell-free  fetal DNA screening, the risks of fetal  aneuploidies are not increased.  Patient is a silent carrier for alpha thalassemia (aa/a-).  I briefly discussed the genetics and recommended genetic  counseling. ---------------------------------------------------------------------- Recommendations  Genetic counseling appointment was made.  Follow-up scans as clinically indicated. ----------------------------------------------------------------------                  Tama High, MD Electronically Signed Final Report   12/29/2019 04:16 pm ----------------------------------------------------------------------   Assessment and Plan:  Pregnancy: G3P2002 at 103w1d1. Encounter for supervision of low-risk pregnancy in second trimester - Patient doing well, reports being tired today  - Patient is concerned about how much fetal movement she is feeling, reports feeling movement every other day  - educated and discussed fetal movement during pregnancy with anterior placenta, discussed that fetal movement will continue to increase later in pregnancy as uterus grows and baby gets bigger.  - Discussed with patient that once she feels consistent fetal movement daily then that will continue, patient verbalizes understanding  - Routine prenatal care - Anticipatory guidance on upcoming appointments with next being GTT, discussed with patient to fast after MN prior to next appointment, patient verbalizes understanding  - Discussed with patient move of office location and where office will be located.   2. Rubella non-immune status, antepartum - MMR PP   Preterm labor symptoms and general obstetric precautions including but not limited to vaginal bleeding, contractions, leaking of fluid and fetal movement were reviewed in detail with the patient. I discussed the assessment and treatment plan with the patient. The patient was provided an opportunity to ask questions and all were answered. The patient agreed with the plan and  demonstrated an understanding of the instructions. The patient was advised to call back or seek an in-person office evaluation[facility]. Please refer to After Visit Summary for other counseling recommendations.   I provided 10 minutes of face-to-face time during this encounter.  Return in about 5 weeks (around 02/16/2020) for LROB/GTT.  Future Appointments  Date Time Provider [place] 01/12/2020  2:30 PM [place]  02/16/2020  8:20 AM WOC-WOCA LAB WOC-WOCA WOC  02/16/2020 10:15 AM WLuvenia Redden PA-C WOC-WOCA WOC    VKatheran James  Stann Mainland, [place] for [employer], [place]

## 2020-01-12 NOTE — Patient Instructions (Signed)
Glucose Tolerance Test During Pregnancy Why am I having this test? The glucose tolerance test (GTT) is done to check how your body processes sugar (glucose). This is one of several tests used to diagnose diabetes that develops during pregnancy (gestational diabetes mellitus). Gestational diabetes is a temporary form of diabetes that some women develop during pregnancy. It usually occurs during the second trimester of pregnancy and goes away after delivery. Testing (screening) for gestational diabetes usually occurs between 24 and 28 weeks of pregnancy. You may have the GTT test after having a 1-hour glucose screening test if the results from that test indicate that you may have gestational diabetes. You may also have this test if:  You have a history of gestational diabetes.  You have a history of giving birth to very large babies or have experienced repeated fetal loss (stillbirth).  You have signs and symptoms of diabetes, such as: ? Changes in your vision. ? Tingling or numbness in your hands or feet. ? Changes in hunger, thirst, and urination that are not otherwise explained by your pregnancy. What is being tested? This test measures the amount of glucose in your blood at different times during a period of 3 hours. This indicates how well your body is able to process glucose. What kind of sample is taken?  Blood samples are required for this test. They are usually collected by inserting a needle into a blood vessel. How do I prepare for this test?  For 3 days before your test, eat normally. Have plenty of carbohydrate-rich foods.  Follow instructions from your health care provider about: ? Eating or drinking restrictions on the day of the test. You may be asked to not eat or drink anything other than water (fast) starting 8-10 hours before the test. ? Changing or stopping your regular medicines. Some medicines may interfere with this test. Tell a health care provider about:  All  medicines you are taking, including vitamins, herbs, eye drops, creams, and over-the-counter medicines.  Any blood disorders you have.  Any surgeries you have had.  Any medical conditions you have. What happens during the test? First, your blood glucose will be measured. This is referred to as your fasting blood glucose, since you fasted before the test. Then, you will drink a glucose solution that contains a certain amount of glucose. Your blood glucose will be measured again 1, 2, and 3 hours after drinking the solution. This test takes about 3 hours to complete. You will need to stay at the testing location during this time. During the testing period:  Do not eat or drink anything other than the glucose solution.  Do not exercise.  Do not use any products that contain nicotine or tobacco, such as cigarettes and e-cigarettes. If you need help stopping, ask your health care provider. The testing procedure may vary among health care providers and hospitals. How are the results reported? Your results will be reported as milligrams of glucose per deciliter of blood (mg/dL) or millimoles per liter (mmol/L). Your health care provider will compare your results to normal ranges that were established after testing a large group of people (reference ranges). Reference ranges may vary among labs and hospitals. For this test, common reference ranges are:  Fasting: less than 95-105 mg/dL (5.3-5.8 mmol/L).  1 hour after drinking glucose: less than 180-190 mg/dL (10.0-10.5 mmol/L).  2 hours after drinking glucose: less than 155-165 mg/dL (8.6-9.2 mmol/L).  3 hours after drinking glucose: 140-145 mg/dL (7.8-8.1 mmol/L). What do the   results mean? Results within reference ranges are considered normal, meaning that your glucose levels are well-controlled. If two or more of your blood glucose levels are high, you may be diagnosed with gestational diabetes. If only one level is high, your health care  provider may suggest repeat testing or other tests to confirm a diagnosis. Talk with your health care provider about what your results mean. Questions to ask your health care provider Ask your health care provider, or the department that is doing the test:  When will my results be ready?  How will I get my results?  What are my treatment options?  What other tests do I need?  What are my next steps? Summary  The glucose tolerance test (GTT) is one of several tests used to diagnose diabetes that develops during pregnancy (gestational diabetes mellitus). Gestational diabetes is a temporary form of diabetes that some women develop during pregnancy.  You may have the GTT test after having a 1-hour glucose screening test if the results from that test indicate that you may have gestational diabetes. You may also have this test if you have any symptoms or risk factors for gestational diabetes.  Talk with your health care provider about what your results mean. This information is not intended to replace advice given to you by your health care provider. Make sure you discuss any questions you have with your health care provider. Document Revised: 01/07/2019 Document Reviewed: 04/28/2017 Elsevier Patient Education  2020 Elsevier Inc.  

## 2020-02-16 ENCOUNTER — Other Ambulatory Visit: Payer: Medicaid Other

## 2020-02-16 ENCOUNTER — Encounter: Payer: Medicaid Other | Admitting: Medical

## 2020-02-16 ENCOUNTER — Telehealth: Payer: Self-pay | Admitting: Medical

## 2020-02-16 NOTE — Telephone Encounter (Signed)
Attempted to reach patient about her missed appointment. I was able to leave a voicemail message for her to call and get rescheduled.

## 2020-02-21 ENCOUNTER — Other Ambulatory Visit: Payer: Self-pay | Admitting: *Deleted

## 2020-02-21 DIAGNOSIS — Z3490 Encounter for supervision of normal pregnancy, unspecified, unspecified trimester: Secondary | ICD-10-CM

## 2020-02-22 ENCOUNTER — Ambulatory Visit (INDEPENDENT_AMBULATORY_CARE_PROVIDER_SITE_OTHER): Payer: Medicaid Other | Admitting: Student

## 2020-02-22 ENCOUNTER — Other Ambulatory Visit: Payer: Self-pay

## 2020-02-22 ENCOUNTER — Other Ambulatory Visit: Payer: Medicaid Other

## 2020-02-22 VITALS — BP 103/68 | HR 78 | Wt 212.0 lb

## 2020-02-22 DIAGNOSIS — Z23 Encounter for immunization: Secondary | ICD-10-CM

## 2020-02-22 DIAGNOSIS — Z3493 Encounter for supervision of normal pregnancy, unspecified, third trimester: Secondary | ICD-10-CM

## 2020-02-22 DIAGNOSIS — Z3483 Encounter for supervision of other normal pregnancy, third trimester: Secondary | ICD-10-CM

## 2020-02-22 DIAGNOSIS — Z3A28 28 weeks gestation of pregnancy: Secondary | ICD-10-CM

## 2020-02-22 DIAGNOSIS — Z3490 Encounter for supervision of normal pregnancy, unspecified, unspecified trimester: Secondary | ICD-10-CM | POA: Diagnosis not present

## 2020-02-22 NOTE — Progress Notes (Signed)
   PRENATAL VISIT NOTE  Subjective:  Patricia Villanueva is a 29 y.o. G3P2002 at [redacted]w[redacted]d being seen today for ongoing prenatal care.  She is currently monitored for the following issues for this low-risk pregnancy and has History of irregular menstrual cycles; Supervision of low-risk pregnancy; History of COVID-19; Rubella non-immune status, antepartum; and Alpha thalassemia silent carrier on their problem list.  Patient reports no complaints. She is interested in COVID 19 vaccine. She has BP cuff at home.  Contractions: Not present. Vag. Bleeding: None.  Movement: Present. Denies leaking of fluid.   The following portions of the patient's history were reviewed and updated as appropriate: allergies, current medications, past family history, past medical history, past social history, past surgical history and problem list.   Objective:   Vitals:   02/22/20 1104  BP: 103/68  Pulse: 78  Weight: 212 lb (96.2 kg)    Fetal Status: Fetal Heart Rate (bpm): 143 Fundal Height: 27 cm Movement: Present     General:  Alert, oriented and cooperative. Patient is in no acute distress.  Skin: Skin is warm and dry. No rash noted.   Cardiovascular: Normal heart rate noted  Respiratory: Normal respiratory effort, no problems with respiration noted  Abdomen: Soft, gravid, appropriate for gestational age.  Pain/Pressure: Absent     Pelvic: Cervical exam deferred        Extremities: Normal range of motion.  Edema: None  Mental Status: Normal mood and affect. Normal behavior. Normal judgment and thought content.   Assessment and Plan:  Pregnancy: G3P2002 at [redacted]w[redacted]d 1. Encounter for supervision of low-risk pregnancy in third trimester -2 Hour Today -Signed BTL , still considering BTL vs. Nexplanon.  -Encouraged patient to consider Covid vaccine -BP warning signs reviewed. Reviewed warning blood pressure values (systolic = / > 140 and/or diastolic =/> 90). Explained that, if blood pressure is elevated, she should sit  down, rest, and eat/drink something. If still elevated 15 minutes later, and she is greater than 20 weeks, she should call clinic or come to MAU. She should come to MAU if she has elevated pressures and any of the following:  - headache not relieved with tylenol, rest, hydration -blurry vision, floating spots in her vision - sudden full-body edema or facial edema -RUQ pain that is constant.  These symptoms may indicate that her blood pressure is worsening and she may be developing gestational hypertension or pre-eclampsia, which is an emergency.   - Tdap vaccine greater than or equal to 7yo IM  Preterm labor symptoms and general obstetric precautions including but not limited to vaginal bleeding, contractions, leaking of fluid and fetal movement were reviewed in detail with the patient. Please refer to After Visit Summary for other counseling recommendations.   Return in about 4 weeks (around 03/21/2020), or LROB on My Chart.  Future Appointments  Date Time Provider Department Center  03/22/2020  8:15 AM Raelyn Mora, CNM [facility]    Charlesetta Garibaldi [place], [place]

## 2020-02-23 ENCOUNTER — Encounter: Payer: Self-pay | Admitting: *Deleted

## 2020-02-23 LAB — CBC
Hematocrit: 31.8 % — ABNORMAL LOW (ref 34.0–46.6)
Hemoglobin: 10.4 g/dL — ABNORMAL LOW (ref 11.1–15.9)
MCH: 27.8 pg (ref 26.6–33.0)
MCHC: 32.7 g/dL (ref 31.5–35.7)
MCV: 85 fL (ref 79–97)
Platelets: 149 10*3/uL — ABNORMAL LOW (ref 150–450)
RBC: 3.74 x10E6/uL — ABNORMAL LOW (ref 3.77–5.28)
RDW: 12.3 % (ref 11.7–15.4)
WBC: 9.9 10*3/uL (ref 3.4–10.8)

## 2020-02-23 LAB — GLUCOSE TOLERANCE, 2 HOURS W/ 1HR
Glucose, 1 hour: 150 mg/dL (ref 65–179)
Glucose, 2 hour: 109 mg/dL (ref 65–152)
Glucose, Fasting: 86 mg/dL (ref 65–91)

## 2020-02-23 LAB — HIV ANTIBODY (ROUTINE TESTING W REFLEX): HIV Screen 4th Generation wRfx: NONREACTIVE

## 2020-02-23 LAB — RPR: RPR Ser Ql: NONREACTIVE

## 2020-03-22 ENCOUNTER — Telehealth (INDEPENDENT_AMBULATORY_CARE_PROVIDER_SITE_OTHER): Payer: Medicaid Other | Admitting: Obstetrics and Gynecology

## 2020-03-22 ENCOUNTER — Encounter: Payer: Self-pay | Admitting: Obstetrics and Gynecology

## 2020-03-22 ENCOUNTER — Other Ambulatory Visit: Payer: Self-pay

## 2020-03-22 VITALS — BP 99/69 | HR 120

## 2020-03-22 DIAGNOSIS — O99891 Other specified diseases and conditions complicating pregnancy: Secondary | ICD-10-CM

## 2020-03-22 DIAGNOSIS — M549 Dorsalgia, unspecified: Secondary | ICD-10-CM | POA: Diagnosis not present

## 2020-03-22 DIAGNOSIS — Z3A32 32 weeks gestation of pregnancy: Secondary | ICD-10-CM

## 2020-03-22 DIAGNOSIS — Z3493 Encounter for supervision of normal pregnancy, unspecified, third trimester: Secondary | ICD-10-CM | POA: Diagnosis not present

## 2020-03-22 DIAGNOSIS — Z5329 Procedure and treatment not carried out because of patient's decision for other reasons: Secondary | ICD-10-CM

## 2020-03-22 DIAGNOSIS — Z91199 Patient's noncompliance with other medical treatment and regimen due to unspecified reason: Secondary | ICD-10-CM

## 2020-03-22 MED ORDER — COMFORT FIT MATERNITY SUPP SM MISC: 1.0000 [IU] | Freq: Every day | 0 refills | Status: DC | PRN

## 2020-03-22 MED ORDER — CYCLOBENZAPRINE HCL 10 MG PO TABS: 10.0000 mg | ORAL_TABLET | Freq: Three times a day (TID) | ORAL | 1 refills | Status: DC | PRN

## 2020-03-22 NOTE — Progress Notes (Signed)
@  822 No answer LVM will attempt in a few mins.  @841am  LVM with appointment information and request to call the office back.

## 2020-03-22 NOTE — Progress Notes (Signed)
MY CHART VIDEO VIRTUAL OBSTETRICS VISIT ENCOUNTER NOTE  I connected with Patricia Villanueva on 03/22/20 at  2:15 PM EDT by My Chart video at home and verified that I am speaking with the correct person using two identifiers. Provider located at [employer] for [employer] at [employer] [facility].   I discussed the limitations, risks, security and privacy concerns of performing an evaluation and management service by My Chart video and the availability of in person appointments. I also discussed with the patient that there may be a patient responsible charge related to this service. The patient expressed understanding and agreed to proceed.  Subjective:  Patricia Villanueva is a 29 y.o. G3P2002 at [redacted]w[redacted]d being followed for ongoing prenatal care.  She is currently monitored for the following issues for this low-risk pregnancy and has History of irregular menstrual cycles; Supervision of low-risk pregnancy; History of COVID-19; Rubella non-immune status, antepartum; and Alpha thalassemia silent carrier on their problem list.  Patient reports backache. Pain is mainly with sitting down. The pain is still there with moving around. Sleeps with on a heating pad. "It just feels really stiff." Unsure of BH ctxs; "tightens really, really bad but then lets up." Reports fetal movement. Denies any contractions, bleeding or leaking of fluid.   The following portions of the patient's history were reviewed and updated as appropriate: allergies, current medications, past family history, past medical history, past social history, past surgical history and problem list.   Objective:   General:  Alert, oriented and cooperative.   Mental Status: Normal mood and affect perceived. Normal judgment and thought content.  Rest of physical exam deferred due to type of encounter  BP 99/69   Pulse (!) 120   LMP  (LMP Unknown) Comment: had one in august, irregular periods **Done by patient's own at home BP cuff and scale  Assessment  and Plan:  Pregnancy: G3P2002 at [redacted]w[redacted]d  1. Encounter for supervision of low-risk pregnancy in third trimester  2. Back pain affecting pregnancy in third trimester - Information provided on back pain in pregnancy  - Rx for Elastic Bandages & Supports (COMFORT FIT MATERNITY SUPP SM) MISC; 1 Units by Does not apply route daily as needed.  Dispense: 1 each; Refill: 0 - Advised to come pick up Rx from front desk - Rx for cyclobenzaprine (FLEXERIL) 10 MG tablet; Take 1 tablet (10 mg total) by mouth every 8 (eight) hours as needed for muscle spasms.  Dispense: 30 tablet; Refill: 1  - Advised to soak in tub of warm water with 1/2 cup Epsom salt in tub  Preterm labor symptoms and general obstetric precautions including but not limited to vaginal bleeding, contractions, leaking of fluid and fetal movement were reviewed in detail with the patient.  I discussed the assessment and treatment plan with the patient. The patient was provided an opportunity to ask questions and all were answered. The patient agreed with the plan and demonstrated an understanding of the instructions. The patient was advised to call back or seek an in-person office evaluation/go to MAU at [facility] for any urgent or concerning symptoms. Please refer to After Visit Summary for other counseling recommendations.   I provided 5 minutes of non-face-to-face time during this encounter. There was 5 minutes of chart review time spent prior to this encounter. Total time spent = 10 minutes.  Return in about 4 weeks (around 04/19/2020) for Return OB w/GBS.  No future appointments.  Raelyn Mora, CNM [facility]

## 2020-03-22 NOTE — Progress Notes (Signed)
I connected with  Franki Cabot on 03/22/20 at  2:15 PM EDT by telephone and verified that I am speaking with the correct person using two identifiers.   I discussed the limitations, risks, security and privacy concerns of performing an evaluation and management service by telephone and the availability of in person appointments. I also discussed with the patient that there may be a patient responsible charge related to this service. The patient expressed understanding and agreed to proceed.  Janene Madeira Alayzha An, CMA 03/22/2020  2:09 PM

## 2020-03-22 NOTE — Patient Instructions (Addendum)
Group B Streptococcus Test During Pregnancy Why am I having this test? Routine testing, also called screening, for group B streptococcus (GBS) is recommended for all pregnant women between the 36th and 37th week of pregnancy. GBS is a type of bacteria that can be passed from mother to baby during childbirth. Screening will help guide whether or not you will need treatment during labor and delivery to prevent complications such as:  An infection in your uterus during labor.  An infection in your uterus after delivery.  A serious infection in your baby after delivery, such as pneumonia, meningitis, or sepsis. GBS screening is not often done before 36 weeks of pregnancy unless you go into labor prematurely. What happens if I have group B streptococcus? If testing shows that you have GBS, your health care provider will recommend treatment with IV antibiotics during labor and delivery. This treatment significantly decreases the risk of complications for you and your baby. If you have a planned C-section and you have GBS, you may not need to be treated with antibiotics because GBS is usually passed to babies after labor starts and your water breaks. If you are in labor or your water breaks before your C-section, it is possible for GBS to get into your uterus and be passed to your baby, so you might need treatment. Is there a chance I may not need to be tested? You may not need to be tested for GBS if:  You have a urine test that shows GBS before 36 to 37 weeks.  You had a baby with GBS infection after a previous delivery. In these cases, you will automatically be treated for GBS during labor and delivery. What is being tested? This test is done to check if you have group B streptococcus in your vagina or rectum. What kind of sample is taken? To collect samples for this test, your health care provider will swab your vagina and rectum with a cotton swab. The sample is then sent to the lab to see if  GBS is present. What happens during the test?   You will remove your clothing from the waist down.  You will lie down on an exam table in the same position as you would for a pelvic exam.  Your health care provider will swab your vagina and rectum to collect samples for a culture test.  You will be able to go home after the test and do all your usual activities. How are the results reported? The test results are reported as positive or negative. What do the results mean?  A positive test means you are at risk for passing GBS to your baby during labor and delivery. Your health care provider will recommend that you are treated with an IV antibiotic during labor and delivery.  A negative test means you are at very low risk of passing GBS to your baby. There is still a low risk of passing GBS to your baby because sometimes test results may report that you do not have a condition when you do (false-negative result) or there is a chance that you may become infected with GBS after the test is done. You most likely will not need to be treated with an antibiotic during labor and delivery. Talk with your health care provider about what your results mean. Questions to ask your health care provider Ask your health care provider, or the department that is doing the test:  When will my results be ready?  How will I   get my results?  What are my treatment options? Summary  Routine testing (screening) for group B streptococcus (GBS) is recommended for all pregnant women between the 36th and 37th week of pregnancy.  GBS is a type of bacteria that can be passed from mother to baby during childbirth.  If testing shows that you have GBS, your health care provider will recommend that you are treated with IV antibiotics during labor and delivery. This treatment almost always prevents infection in newborns. This information is not intended to replace advice given to you by your health care provider. Make  sure you discuss any questions you have with your health care provider. Document Revised: 01/07/2019 Document Reviewed: 10/14/2018 Elsevier Patient Education  [place].  Back Pain in Pregnancy Back pain during pregnancy is common. Back pain may be caused by several factors that are related to changes during your pregnancy. Follow these instructions at home: Managing pain, stiffness, and swelling      If directed, for sudden (acute) back pain, put ice on the painful area. ? Put ice in a plastic bag. ? Place a towel between your skin and the bag. ? Leave the ice on for 20 minutes, 2-3 times per day.  If directed, apply heat to the affected area before you exercise. Use the heat source that your health care provider recommends, such as a moist heat pack or a heating pad. ? Place a towel between your skin and the heat source. ? Leave the heat on for 20-30 minutes. ? Remove the heat if your skin turns bright red. This is especially important if you are unable to feel pain, heat, or cold. You may have a greater risk of getting burned.  If directed, massage the affected area. Activity  Exercise as told by your health care provider. Gentle exercise is the best way to prevent or manage back pain.  Listen to your body when lifting. If lifting hurts, ask for help or bend your knees. This uses your leg muscles instead of your back muscles.  Squat down when picking up something from the floor. Do not bend over.  Only use bed rest for short periods as told by your health care provider. Bed rest should only be used for the most severe episodes of back pain. Standing, sitting, and lying down  Do not stand in one place for long periods of time.  Use good posture when sitting. Make sure your head rests over your shoulders and is not hanging forward. Use a pillow on your lower back if necessary.  Try sleeping on your side, preferably the left side, with a pregnancy support pillow or 1-2  regular pillows between your legs. ? If you have back pain after a night's rest, your bed may be too soft. ? A firm mattress may provide more support for your back during pregnancy. General instructions  Do not wear high heels.  Eat a healthy diet. Try to gain weight within your health care provider's recommendations.  Use a maternity girdle, elastic sling, or back brace as told by your health care provider.  Take over-the-counter and prescription medicines only as told by your health care provider.  Work with a physical therapist or massage therapist to find ways to manage back pain. Acupuncture or massage therapy may be helpful.  Keep all follow-up visits as told by your health care provider. This is important. Contact a health care provider if:  Your back pain interferes with your daily activities.  You have  increasing pain in other parts of your body. Get help right away if:  You develop numbness, tingling, weakness, or problems with the use of your arms or legs.  You develop severe back pain that is not controlled with medicine.  You have a change in bowel or bladder control.  You develop shortness of breath, dizziness, or you faint.  You develop nausea, vomiting, or sweating.  You have back pain that is a rhythmic, cramping pain similar to labor pains. Labor pain is usually 1-2 minutes apart, lasts for about 1 minute, and involves a bearing down feeling or pressure in your pelvis.  You have back pain and your water breaks or you have vaginal bleeding.  You have back pain or numbness that travels down your leg.  Your back pain developed after you fell.  You develop pain on one side of your back.  You see blood in your urine.  You develop skin blisters in the area of your back pain. Summary  Back pain may be caused by several factors that are related to changes during your pregnancy.  Follow instructions as told by your health care provider for managing pain,  stiffness, and swelling.  Exercise as told by your health care provider. Gentle exercise is the best way to prevent or manage back pain.  Take over-the-counter and prescription medicines only as told by your health care provider.  Keep all follow-up visits as told by your health care provider. This is important. This information is not intended to replace advice given to you by your health care provider. Make sure you discuss any questions you have with your health care provider. Document Revised: 01/05/2019 Document Reviewed: 03/04/2018 Elsevier Patient Education  2020 [employer].

## 2020-04-07 ENCOUNTER — Telehealth (INDEPENDENT_AMBULATORY_CARE_PROVIDER_SITE_OTHER): Payer: Medicaid Other | Admitting: Obstetrics and Gynecology

## 2020-04-07 DIAGNOSIS — Z3483 Encounter for supervision of other normal pregnancy, third trimester: Secondary | ICD-10-CM

## 2020-04-07 DIAGNOSIS — Z3493 Encounter for supervision of normal pregnancy, unspecified, third trimester: Secondary | ICD-10-CM

## 2020-04-07 DIAGNOSIS — Z3A34 34 weeks gestation of pregnancy: Secondary | ICD-10-CM

## 2020-04-07 NOTE — Telephone Encounter (Signed)
Patient is requesting a call back about a prescription she needs fill someone else because medicaid won't cover at [facility].

## 2020-04-07 NOTE — Telephone Encounter (Signed)
Patient needing prescription for maternity belt filled. Advised her to try [facility] to get it filled. Patient voiced understanding.

## 2020-04-24 ENCOUNTER — Other Ambulatory Visit: Payer: Self-pay

## 2020-04-24 ENCOUNTER — Ambulatory Visit (INDEPENDENT_AMBULATORY_CARE_PROVIDER_SITE_OTHER): Payer: Medicaid Other | Admitting: Obstetrics and Gynecology

## 2020-04-24 ENCOUNTER — Other Ambulatory Visit (HOSPITAL_COMMUNITY)
Admission: RE | Admit: 2020-04-24 | Discharge: 2020-04-24 | Disposition: A | Discharge status: Home / Self Care | Payer: Medicaid Other | Source: Ambulatory Visit | Attending: Obstetrics and Gynecology | Admitting: Obstetrics and Gynecology

## 2020-04-24 ENCOUNTER — Encounter: Payer: Self-pay | Admitting: Obstetrics and Gynecology

## 2020-04-24 VITALS — BP 110/68 | HR 89 | Wt 214.5 lb

## 2020-04-24 DIAGNOSIS — Z3490 Encounter for supervision of normal pregnancy, unspecified, unspecified trimester: Secondary | ICD-10-CM | POA: Diagnosis not present

## 2020-04-24 DIAGNOSIS — Z3483 Encounter for supervision of other normal pregnancy, third trimester: Secondary | ICD-10-CM

## 2020-04-24 DIAGNOSIS — Z3A36 36 weeks gestation of pregnancy: Secondary | ICD-10-CM

## 2020-04-24 NOTE — Progress Notes (Signed)
   LOW-RISK PREGNANCY OFFICE VISIT Patient name: Patricia Villanueva MRN [id number]  Date of birth: 1991-03-09 Chief Complaint:   Routine Prenatal Visit  History of Present Illness:   Patricia Villanueva is a 29 y.o. G65P2002 female at [redacted]w[redacted]d with an Estimated Date of Delivery: 05/16/20 being seen today for ongoing management of a low-risk pregnancy.  Today she reports no complaints. She did not get maternity support belt, Flexeril or try soaking a tub of warm water. She does reports that a heating pad to her lower back is most helpful. Contractions: Irregular. Vag. Bleeding: None.  Movement: Present. denies leaking of fluid. Review of Systems:   Pertinent items are noted in HPI Denies abnormal vaginal discharge w/ itching/odor/irritation, headaches, visual changes, shortness of breath, chest pain, abdominal pain, severe nausea/vomiting, or problems with urination or bowel movements unless otherwise stated above. Pertinent History Reviewed:  Reviewed past medical,surgical, social, obstetrical and family history.  Reviewed problem list, medications and allergies. Physical Assessment:   Vitals:   04/24/20 1601  BP: 110/68  Pulse: 89  Weight: (!) 214 lb 8 oz (97.3 kg)  Body mass index is 33.6 kg/m.        Physical Examination:   General appearance: Well appearing, and in no distress  Mental status: Alert, oriented to person, place, and time  Skin: Warm & dry  Cardiovascular: Normal heart rate noted  Respiratory: Normal respiratory effort, no distress  Abdomen: Soft, gravid, nontender  Pelvic: Cervical exam performed  Dilation: Closed Effacement (%): 50 Station: -2  Extremities: Edema: Trace  Fetal Status: Fetal Heart Rate (bpm): 130 Fundal Height: 37 cm Movement: Present Presentation: Vertex  No results found for this or any previous visit (from the past 24 hour(s)).  Assessment & Plan:  1) Low-risk pregnancy G3P2002 at [redacted]w[redacted]d with an Estimated Date of Delivery: 05/16/20   2) Encounter for  supervision of low-risk pregnancy, antepartum  - GC/Chlamydia probe amp ([place])not at [facility],  - Culture, beta strep (group b only)    Meds: No orders of the defined types were placed in this encounter.  Labs/procedures today: GBS test, GC/CT  Plan:  Continue routine obstetrical care   Reviewed: Preterm labor symptoms and general obstetric precautions including but not limited to vaginal bleeding, contractions, leaking of fluid and fetal movement were reviewed in detail with the patient.  All questions were answered. Has home bp cuff. Check bp weekly, let us know if >140/90.   Follow-up: Return in about 1 week (around 05/01/2020) for Return OB visit.  Orders Placed This Encounter  Procedures  . Culture, beta strep (group b only)   Raelyn Mora MSN, CNM 04/24/2020 4:26 PM

## 2020-04-25 LAB — GC/CHLAMYDIA PROBE AMP (~~LOC~~) NOT AT ARMC
Chlamydia: NEGATIVE
Comment: NEGATIVE
Comment: NORMAL
Neisseria Gonorrhea: NEGATIVE

## 2020-04-27 LAB — CULTURE, BETA STREP (GROUP B ONLY): Strep Gp B Culture: NEGATIVE

## 2020-05-03 ENCOUNTER — Other Ambulatory Visit: Payer: Self-pay

## 2020-05-03 ENCOUNTER — Ambulatory Visit (INDEPENDENT_AMBULATORY_CARE_PROVIDER_SITE_OTHER): Payer: Medicaid Other | Admitting: Nurse Practitioner

## 2020-05-03 VITALS — BP 129/84 | HR 75 | Wt 213.8 lb

## 2020-05-03 DIAGNOSIS — Z3490 Encounter for supervision of normal pregnancy, unspecified, unspecified trimester: Secondary | ICD-10-CM

## 2020-05-03 DIAGNOSIS — Z3A38 38 weeks gestation of pregnancy: Secondary | ICD-10-CM

## 2020-05-03 NOTE — Progress Notes (Signed)
    Subjective:  Patricia Villanueva is a 29 y.o. G3P2002 at [redacted]w[redacted]d being seen today for ongoing prenatal care.  She is currently monitored for the following issues for this low-risk pregnancy and has History of irregular menstrual cycles; Supervision of low-risk pregnancy; History of COVID-19; Rubella non-immune status, antepartum; and Alpha thalassemia silent carrier on their problem list.  Patient reports backache and occasional contractions.  Contractions: Irregular. Vag. Bleeding: None.  Movement: Present. Denies leaking of fluid.   The following portions of the patient's history were reviewed and updated as appropriate: allergies, current medications, past family history, past medical history, past social history, past surgical history and problem list. Problem list updated.  Objective:   Vitals:   05/03/20 1057  BP: 129/84  Pulse: 75  Weight: 213 lb 12.8 oz (97 kg)    Fetal Status: Fetal Heart Rate (bpm): 135 Fundal Height: 40 cm Movement: Present  Presentation: Vertex  General:  Alert, oriented and cooperative. Patient is in no acute distress.  Skin: Skin is warm and dry. No rash noted.   Cardiovascular: Normal heart rate noted  Respiratory: Normal respiratory effort, no problems with respiration noted  Abdomen: Soft, gravid, appropriate for gestational age. Pain/Pressure: Present     Pelvic:  Cervical exam performed Dilation: 2 Effacement (%): 50 Station: -2  Extremities: Normal range of motion.  Edema: None  Mental Status: Normal mood and affect. Normal behavior. Normal judgment and thought content.   Urinalysis:      Assessment and Plan:  Pregnancy: G3P2002 at [redacted]w[redacted]d  1. Encounter for supervision of low-risk pregnancy, antepartum Advised of warning signs of high blood pressure - headache, Changes in vision, edema, RUQ pain No BPs listed in babyscripts BP today is normal and baby is moving well.  2. [redacted] weeks gestation of pregnancy  Term labor symptoms and general obstetric  precautions including but not limited to vaginal bleeding, contractions, leaking of fluid and fetal movement were reviewed in detail with the patient. Please refer to After Visit Summary for other counseling recommendations.  Return in about 1 week (around 05/10/2020) for in person ROB.  Nolene Bernheim, RN, MSN, NP-BC Nurse Practitioner, [facility] for [employer], [facility] 05/03/2020 11:23 AM

## 2020-05-06 ENCOUNTER — Encounter (HOSPITAL_COMMUNITY): Payer: Self-pay | Admitting: Obstetrics & Gynecology

## 2020-05-06 ENCOUNTER — Inpatient Hospital Stay (HOSPITAL_COMMUNITY)
Admission: AD | Admit: 2020-05-06 | Discharge: 2020-05-06 | Disposition: A | Discharge status: Home / Self Care | Payer: Medicaid Other | Attending: Obstetrics & Gynecology | Admitting: Obstetrics & Gynecology

## 2020-05-06 ENCOUNTER — Other Ambulatory Visit: Payer: Self-pay

## 2020-05-06 DIAGNOSIS — R519 Headache, unspecified: Secondary | ICD-10-CM | POA: Insufficient documentation

## 2020-05-06 DIAGNOSIS — Z3A38 38 weeks gestation of pregnancy: Secondary | ICD-10-CM

## 2020-05-06 DIAGNOSIS — O26893 Other specified pregnancy related conditions, third trimester: Secondary | ICD-10-CM

## 2020-05-06 DIAGNOSIS — Z3490 Encounter for supervision of normal pregnancy, unspecified, unspecified trimester: Secondary | ICD-10-CM

## 2020-05-06 DIAGNOSIS — Z8616 Personal history of COVID-19: Secondary | ICD-10-CM

## 2020-05-06 DIAGNOSIS — O471 False labor at or after 37 completed weeks of gestation: Secondary | ICD-10-CM | POA: Diagnosis not present

## 2020-05-06 DIAGNOSIS — O99891 Other specified diseases and conditions complicating pregnancy: Secondary | ICD-10-CM | POA: Insufficient documentation

## 2020-05-06 DIAGNOSIS — O479 False labor, unspecified: Secondary | ICD-10-CM

## 2020-05-06 LAB — CBC
HCT: 36 % (ref 36.0–46.0)
Hemoglobin: 11.6 g/dL — ABNORMAL LOW (ref 12.0–15.0)
MCH: 27.9 pg (ref 26.0–34.0)
MCHC: 32.2 g/dL (ref 30.0–36.0)
MCV: 86.5 fL (ref 80.0–100.0)
Platelets: 154 10*3/uL (ref 150–400)
RBC: 4.16 MIL/uL (ref 3.87–5.11)
RDW: 12.8 % (ref 11.5–15.5)
WBC: 8.4 10*3/uL (ref 4.0–10.5)
nRBC: 0 % (ref 0.0–0.2)

## 2020-05-06 LAB — URINALYSIS, ROUTINE W REFLEX MICROSCOPIC
Bilirubin Urine: NEGATIVE
Glucose, UA: 50 mg/dL — AB
Hgb urine dipstick: NEGATIVE
Ketones, ur: NEGATIVE mg/dL
Nitrite: NEGATIVE
Protein, ur: 30 mg/dL — AB
Specific Gravity, Urine: 1.026 (ref 1.005–1.030)
pH: 7 (ref 5.0–8.0)

## 2020-05-06 LAB — COMPREHENSIVE METABOLIC PANEL
ALT: 10 U/L (ref 0–44)
AST: 15 U/L (ref 15–41)
Albumin: 3 g/dL — ABNORMAL LOW (ref 3.5–5.0)
Alkaline Phosphatase: 127 U/L — ABNORMAL HIGH (ref 38–126)
Anion gap: 9 (ref 5–15)
BUN: 6 mg/dL (ref 6–20)
CO2: 21 mmol/L — ABNORMAL LOW (ref 22–32)
Calcium: 8.6 mg/dL — ABNORMAL LOW (ref 8.9–10.3)
Chloride: 108 mmol/L (ref 98–111)
Creatinine, Ser: 0.54 mg/dL (ref 0.44–1.00)
GFR calc Af Amer: >60 mL/min (ref >60)
GFR calc non Af Amer: >60 mL/min (ref >60)
Glucose, Bld: 82 mg/dL (ref 70–99)
Potassium: 3.6 mmol/L (ref 3.5–5.1)
Sodium: 138 mmol/L (ref 135–145)
Total Bilirubin: 0.5 mg/dL (ref 0.3–1.2)
Total Protein: 6.4 g/dL — ABNORMAL LOW (ref 6.5–8.1)

## 2020-05-06 LAB — PROTEIN / CREATININE RATIO, URINE
Creatinine, Urine: 202.8 mg/dL
Protein Creatinine Ratio: 0.14 mg/mg{Cre} (ref 0.00–0.15)
Total Protein, Urine: 29 mg/dL

## 2020-05-06 MED ORDER — DIPHENHYDRAMINE HCL 50 MG/ML IJ SOLN
25.0000 mg | Freq: Once | INTRAMUSCULAR | Status: AC
  Administered 2020-05-06: 25 mg via INTRAVENOUS
  Filled 2020-05-06: qty 1

## 2020-05-06 MED ORDER — LACTATED RINGERS IV BOLUS
1000.0000 mL | Freq: Once | INTRAVENOUS | Status: AC
  Administered 2020-05-06: 1000 mL via INTRAVENOUS

## 2020-05-06 MED ORDER — METOCLOPRAMIDE HCL 5 MG/ML IJ SOLN
10.0000 mg | Freq: Once | INTRAMUSCULAR | Status: AC
  Administered 2020-05-06: 10 mg via INTRAVENOUS
  Filled 2020-05-06: qty 2

## 2020-05-06 MED ORDER — DEXAMETHASONE SODIUM PHOSPHATE 10 MG/ML IJ SOLN
10.0000 mg | Freq: Once | INTRAMUSCULAR | Status: AC
  Administered 2020-05-06: 10 mg via INTRAVENOUS
  Filled 2020-05-06: qty 1

## 2020-05-06 NOTE — MAU Note (Addendum)
2 cm on Wednesday.  Mucus plug came out yesterday.  Since then having contractions and can't distinguish between braxton hicks. Every 30 min felt a pain.   A lot of pain and pressure in low back.  Tried ice pack and heating pad and didn't help. No bleeding. No leaking.  Baby moving well.  Been having a headache since 6 am.  Rates 6 on pain scale. Denies any visual disturbances.

## 2020-05-06 NOTE — MAU Provider Note (Addendum)
History     CSN: [id number]  Arrival date and time: 05/06/20 5009   First Provider Initiated Contact with Patient 05/06/20 2049      Chief Complaint  Patient presents with  . Back Pain  . Contractions  . Headache   Ms. Patricia Villanueva is a 29 y.o. year old G70P2002 female at [redacted]w[redacted]d weeks gestation who presents to MAU reporting contractions every 30 minutes since Friday morning, a lot of pain and pressure in her back, most mucous plug Friday, dilated to 2 cm on May 03, 2020.  She has tried ice pack and heating pad for her back pain with no relief.  She also complains of a headache since 6 AM this morning; pain rated 6 out of 10.  She has taken no medication for the pain.  She denies vaginal bleeding or loss of fluid.  She reports positive fetal movement today.  [facility]   OB History    Gravida  3   Para  2   Term  2   Preterm  0   AB  0   Living  2     SAB  0   TAB  0   Ectopic  0   Multiple  0   Live Births  2           Past Medical History:  Diagnosis Date  . Chlamydia   . Medical history non-contributory     Past Surgical History:  Procedure Laterality Date  . arm surgery     left arm surgery to repair a nerve  . arm surgery    . DENTAL SURGERY    . NERVE EXPLORATION  04/04/2012   Procedure: NERVE EXPLORATION;  Surgeon: Sharma Covert, MD;  Location: [facility] OR;  Service: Orthopedics;  Laterality: Left;  Left Arm Laceration with Nerve Repair    Family History  Problem Relation Age of Onset  . Hypertension Mother   . Alcohol abuse Neg Hx   . Arthritis Neg Hx   . Birth defects Neg Hx   . Asthma Neg Hx   . Cancer Neg Hx   . COPD Neg Hx   . Diabetes Neg Hx   . Depression Neg Hx   . Drug abuse Neg Hx   . Early death Neg Hx   . Hearing loss Neg Hx   . Heart disease Neg Hx   . Hyperlipidemia Neg Hx   . Kidney disease Neg Hx   . Learning disabilities Neg Hx   .  Mental illness Neg Hx   . Mental retardation Neg Hx   . Miscarriages / Stillbirths Neg Hx   . Stroke Neg Hx   . Vision loss Neg Hx     Social History   Tobacco Use  . Smoking status: Never Smoker  . Smokeless tobacco: Never Used  Vaping Use  . Vaping Use: Never used  Substance Use Topics  . Alcohol use: Not Currently  . Drug use: Not Currently    Types: Marijuana    Allergies: No Known Allergies  Medications Prior to Admission  Medication Sig Dispense Refill Last Dose  . Prenatal Vit-Fe Phos-FA-Omega (VITAFOL GUMMIES) 3.33-0.333-34.8 MG CHEW Chew 3 each by mouth daily. 90 tablet 11 05/06/2020 at Unknown time  . Blood Pressure Monitoring DEVI 1 Device by Does not apply route once a week. (Patient not taking: Reported on 11/29/2019) 1 Device  0   . cyclobenzaprine (FLEXERIL) 10 MG tablet Take 1 tablet (10 mg total) by mouth every 8 (eight) hours as needed for muscle spasms. (Patient not taking: Reported on 04/24/2020) 30 tablet 1     Review of Systems  Constitutional: Negative.   HENT: Negative.   Eyes: Negative.   Respiratory: Negative.   Cardiovascular: Negative.   Gastrointestinal: Negative.   Endocrine: Negative.   Genitourinary: Positive for pelvic pain (UC's every 30 mins since yesterday, stronger every day; rated 8/10) and vaginal discharge (mucous plug).  Musculoskeletal: Positive for back pain (chronic throughout pregnancy,).  Skin: Negative.   Allergic/Immunologic: Negative.   Neurological: Positive for headaches (rated 6/10; have not tried any meds).  Hematological: Negative.   Psychiatric/Behavioral: Negative.    Physical Exam   Patient Vitals for the past 24 hrs:  BP Temp Temp src Pulse Resp SpO2 Height Weight  05/06/20 2140 -- -- -- -- -- 100 % -- --  05/06/20 2120 -- -- -- -- -- 99 % -- --  05/06/20 2116 131/81 98.5 F (36.9 C) Oral 85 18 -- -- --  05/06/20 2115 -- -- -- -- -- 99 % -- --  05/06/20 2105 -- -- -- -- -- 100 % -- --  05/06/20 2101 (!) 145/86  -- -- 67 -- -- -- --  05/06/20 2100 -- -- -- -- -- 99 % -- --  05/06/20 2050 -- -- -- -- -- 100 % -- --  05/06/20 2046 134/85 -- -- 81 -- -- -- --  05/06/20 2045 -- -- -- -- -- 100 % -- --  05/06/20 2031 131/80 -- -- 89 -- -- -- --  05/06/20 2030 -- -- -- -- -- 99 % -- --  05/06/20 2025 -- -- -- -- -- 100 % -- --  05/06/20 2020 -- -- -- -- -- 100 % -- --  05/06/20 2016 130/79 -- -- 81 -- -- -- --  05/06/20 2015 -- -- -- -- -- 99 % -- --  05/06/20 2010 132/73 -- -- 82 -- 99 % -- --  05/06/20 2007 134/76 98.4 F (36.9 C) Oral 77 18 100 % -- --  05/06/20 1944 -- -- -- -- -- -- 5\' 7"  (1.702 m) 98.4 kg     Physical Exam Vitals and nursing note reviewed.  Constitutional:      Appearance: She is obese.  HENT:     Head: Normocephalic and atraumatic.  Cardiovascular:     Rate and Rhythm: Normal rate.  Musculoskeletal:        General: Normal range of motion.  Skin:    General: Skin is warm and dry.  Neurological:     Mental Status: She is alert and oriented to person, place, and time.  Psychiatric:        Mood and Affect: Mood normal.        Speech: Speech normal.        Behavior: Behavior normal.    REACTIVE NST - FHR: 135 bpm / moderate variability / accels present / decels absent / TOCO: occ UC's with UI noted  MAU Course  Procedures  MDM CCUA CBC CMP P/C Ratio Serial BP's   Results for orders placed or performed during the hospital encounter of 05/06/20 (from the past 24 hour(s))  Urinalysis, Routine w reflex microscopic Urine, Clean Catch     Status: Abnormal   Collection Time: 05/06/20  8:10 PM  Result Value Ref Range   Color, Urine YELLOW YELLOW  APPearance HAZY (A) CLEAR   Specific Gravity, Urine 1.026 1.005 - 1.030   pH 7.0 5.0 - 8.0   Glucose, UA 50 (A) NEGATIVE mg/dL   Hgb urine dipstick NEGATIVE NEGATIVE   Bilirubin Urine NEGATIVE NEGATIVE   Ketones, ur NEGATIVE NEGATIVE mg/dL   Protein, ur 30 (A) NEGATIVE mg/dL   Nitrite NEGATIVE NEGATIVE    Leukocytes,Ua MODERATE (A) NEGATIVE   RBC / HPF 0-5 0 - 5 RBC/hpf   WBC, UA 6-10 0 - 5 WBC/hpf   Bacteria, UA RARE (A) NONE SEEN   Squamous Epithelial / LPF 11-20 0 - 5   Mucus PRESENT   Protein / creatinine ratio, urine     Status: None   Collection Time: 05/06/20  8:10 PM  Result Value Ref Range   Creatinine, Urine 202.80 mg/dL   Total Protein, Urine 29 mg/dL   Protein Creatinine Ratio 0.14 0.00 - 0.15 mg/mg[Cre]  CBC     Status: Abnormal   Collection Time: 05/06/20  9:13 PM  Result Value Ref Range   WBC 8.4 4.0 - 10.5 K/uL   RBC 4.16 3.87 - 5.11 MIL/uL   Hemoglobin 11.6 (L) 12.0 - 15.0 g/dL   HCT 23.5 36 - 46 %   MCV 86.5 80.0 - 100.0 fL   MCH 27.9 26.0 - 34.0 pg   MCHC 32.2 30.0 - 36.0 g/dL   RDW 36.1 44.3 - 15.4 %   Platelets 154 150 - 400 K/uL   nRBC 0.0 0.0 - 0.2 %  Comprehensive metabolic panel     Status: Abnormal   Collection Time: 05/06/20  9:13 PM  Result Value Ref Range   Sodium 138 135 - 145 mmol/L   Potassium 3.6 3.5 - 5.1 mmol/L   Chloride 108 98 - 111 mmol/L   CO2 21 (L) 22 - 32 mmol/L   Glucose, Bld 82 70 - 99 mg/dL   BUN 6 6 - 20 mg/dL   Creatinine, Ser 0.08 0.44 - 1.00 mg/dL   Calcium 8.6 (L) 8.9 - 10.3 mg/dL   Total Protein 6.4 (L) 6.5 - 8.1 g/dL   Albumin 3.0 (L) 3.5 - 5.0 g/dL   AST 15 15 - 41 U/L   ALT 10 0 - 44 U/L   Alkaline Phosphatase 127 (H) 38 - 126 U/L   Total Bilirubin 0.5 0.3 - 1.2 mg/dL   GFR calc non Af Amer >60 >60 mL/min   GFR calc Af Amer >60 >60 mL/min   Anion gap 9 5 - 15      Assessment and Plan  False labor  - Information provided on false labor   Headache in pregnancy, antepartum, third trimester  - Information provided on general headache without known cause   - Discharge patient - Keep scheduled appt at [facility] on 05/10/2020 - Patient verbalized an understanding of the plan of care and agrees.     Raelyn Mora, MSN, CNM 05/06/2020, 9:42 PM

## 2020-05-10 ENCOUNTER — Ambulatory Visit (INDEPENDENT_AMBULATORY_CARE_PROVIDER_SITE_OTHER): Payer: Medicaid Other | Admitting: Medical

## 2020-05-10 ENCOUNTER — Other Ambulatory Visit: Payer: Self-pay

## 2020-05-10 ENCOUNTER — Encounter: Payer: Self-pay | Admitting: General Practice

## 2020-05-10 VITALS — BP 128/85 | HR 102 | Wt 214.0 lb

## 2020-05-10 DIAGNOSIS — Z3A39 39 weeks gestation of pregnancy: Secondary | ICD-10-CM

## 2020-05-10 DIAGNOSIS — Z3493 Encounter for supervision of normal pregnancy, unspecified, third trimester: Secondary | ICD-10-CM

## 2020-05-10 NOTE — Patient Instructions (Signed)
Fetal Movement Counts Patient Name: ________________________________________________ Patient Due Date: ____________________ What is a fetal movement count?  A fetal movement count is the number of times that you feel your baby move during a certain amount of time. This may also be called a fetal kick count. A fetal movement count is recommended for every pregnant woman. You may be asked to start counting fetal movements as early as week 28 of your pregnancy. Pay attention to when your baby is most active. You may notice your baby's sleep and wake cycles. You may also notice things that make your baby move more. You should do a fetal movement count:  When your baby is normally most active.  At the same time each day. A good time to count movements is while you are resting, after having something to eat and drink. How do I count fetal movements? 1. Find a quiet, comfortable area. Sit, or lie down on your side. 2. Write down the date, the start time and stop time, and the number of movements that you felt between those two times. Take this information with you to your health care visits. 3. Write down your start time when you feel the first movement. 4. Count kicks, flutters, swishes, rolls, and jabs. You should feel at least 10 movements. 5. You may stop counting after you have felt 10 movements, or if you have been counting for 2 hours. Write down the stop time. 6. If you do not feel 10 movements in 2 hours, contact your health care provider for further instructions. Your health care provider may want to do additional tests to assess your baby's well-being. Contact a health care provider if:  You feel fewer than 10 movements in 2 hours.  Your baby is not moving like he or she usually does. Date: ____________ Start time: ____________ Stop time: ____________ Movements: ____________ Date: ____________ Start time: ____________ Stop time: ____________ Movements: ____________ Date: ____________  Start time: ____________ Stop time: ____________ Movements: ____________ Date: ____________ Start time: ____________ Stop time: ____________ Movements: ____________ Date: ____________ Start time: ____________ Stop time: ____________ Movements: ____________ Date: ____________ Start time: ____________ Stop time: ____________ Movements: ____________ Date: ____________ Start time: ____________ Stop time: ____________ Movements: ____________ Date: ____________ Start time: ____________ Stop time: ____________ Movements: ____________ Date: ____________ Start time: ____________ Stop time: ____________ Movements: ____________ This information is not intended to replace advice given to you by your health care provider. Make sure you discuss any questions you have with your health care provider. Document Revised: 05/06/2019 Document Reviewed: 05/06/2019 Elsevier Patient Education  2020 Elsevier Inc. Braxton Hicks Contractions Contractions of the uterus can occur throughout pregnancy, but they are not always a sign that you are in labor. You may have practice contractions called Braxton Hicks contractions. These false labor contractions are sometimes confused with true labor. What are Braxton Hicks contractions? Braxton Hicks contractions are tightening movements that occur in the muscles of the uterus before labor. Unlike true labor contractions, these contractions do not result in opening (dilation) and thinning of the cervix. Toward the end of pregnancy (32-34 weeks), Braxton Hicks contractions can happen more often and may become stronger. These contractions are sometimes difficult to tell apart from true labor because they can be very uncomfortable. You should not feel embarrassed if you go to the hospital with false labor. Sometimes, the only way to tell if you are in true labor is for your health care provider to look for changes in the cervix. The health care provider   will do a physical exam and may  monitor your contractions. If you are not in true labor, the exam should show that your cervix is not dilating and your water has not broken. If there are no other health problems associated with your pregnancy, it is completely safe for you to be sent home with false labor. You may continue to have Braxton Hicks contractions until you go into true labor. How to tell the difference between true labor and false labor True labor  Contractions last 30-70 seconds.  Contractions become very regular.  Discomfort is usually felt in the top of the uterus, and it spreads to the lower abdomen and low back.  Contractions do not go away with walking.  Contractions usually become more intense and increase in frequency.  The cervix dilates and gets thinner. False labor  Contractions are usually shorter and not as strong as true labor contractions.  Contractions are usually irregular.  Contractions are often felt in the front of the lower abdomen and in the groin.  Contractions may go away when you walk around or change positions while lying down.  Contractions get weaker and are shorter-lasting as time goes on.  The cervix usually does not dilate or become thin. Follow these instructions at home:   Take over-the-counter and prescription medicines only as told by your health care provider.  Keep up with your usual exercises and follow other instructions from your health care provider.  Eat and drink lightly if you think you are going into labor.  If Braxton Hicks contractions are making you uncomfortable: ? Change your position from lying down or resting to walking, or change from walking to resting. ? Sit and rest in a tub of warm water. ? Drink enough fluid to keep your urine pale yellow. Dehydration may cause these contractions. ? Do slow and deep breathing several times an hour.  Keep all follow-up prenatal visits as told by your health care provider. This is important. Contact a  health care provider if:  You have a fever.  You have continuous pain in your abdomen. Get help right away if:  Your contractions become stronger, more regular, and closer together.  You have fluid leaking or gushing from your vagina.  You pass blood-tinged mucus (bloody show).  You have bleeding from your vagina.  You have low back pain that you never had before.  You feel your baby's head pushing down and causing pelvic pressure.  Your baby is not moving inside you as much as it used to. Summary  Contractions that occur before labor are called Braxton Hicks contractions, false labor, or practice contractions.  Braxton Hicks contractions are usually shorter, weaker, farther apart, and less regular than true labor contractions. True labor contractions usually become progressively stronger and regular, and they become more frequent.  Manage discomfort from Braxton Hicks contractions by changing position, resting in a warm bath, drinking plenty of water, or practicing deep breathing. This information is not intended to replace advice given to you by your health care provider. Make sure you discuss any questions you have with your health care provider. Document Revised: 08/29/2017 Document Reviewed: 01/30/2017 Elsevier Patient Education  2020 Elsevier Inc.    Cervical Ripening (to get your cervix ready for labor) : May try one or all:  Red Raspberry Leaf capsules:  two 300mg or 400mg tablets with each meal, 2-3 times a day  Potential Side Effects Of Raspberry Leaf:  Most women do not experience any side effects   drinking raspberry leaf tea. However, nausea and loose stools are possible     Evening Primrose Oil capsules: may take 1 to 3 capsules daily. May also prick one to release the oil and insert it into your vagina at night.  Some of the potential side effects:  Upset stomach  Loose stools or diarrhea  Headaches  Nausea   4 Dates a day (may taste better if warmed  in microwave until soft). Found where raisins are in the grocery store  [email or web address]

## 2020-05-10 NOTE — Progress Notes (Signed)
   PRENATAL VISIT NOTE  Subjective:  Patricia Villanueva is a 29 y.o. G3P2002 at [redacted]w[redacted]d being seen today for ongoing prenatal care.  She is currently monitored for the following issues for this low-risk pregnancy and has History of irregular menstrual cycles; Supervision of low-risk pregnancy; History of COVID-19; Rubella non-immune status, antepartum; and Alpha thalassemia silent carrier on their problem list.  Patient reports no complaints.  Contractions: Irregular. Vag. Bleeding: Scant.  Movement: Present. Denies leaking of fluid.   The following portions of the patient's history were reviewed and updated as appropriate: allergies, current medications, past family history, past medical history, past social history, past surgical history and problem list.   Objective:   Vitals:   05/10/20 1036  BP: 128/85  Pulse: (!) 102  Weight: 214 lb (97.1 kg)    Fetal Status: Fetal Heart Rate (bpm): 139   Movement: Present     General:  Alert, oriented and cooperative. Patient is in no acute distress.  Skin: Skin is warm and dry. No rash noted.   Cardiovascular: Normal heart rate noted  Respiratory: Normal respiratory effort, no problems with respiration noted  Abdomen: Soft, gravid, appropriate for gestational age.  Pain/Pressure: Present     Pelvic: Cervical exam performed in the presence of a chaperone      2cm/50%/-2  Extremities: Normal range of motion.  Edema: None  Mental Status: Normal mood and affect. Normal behavior. Normal judgment and thought content.   Assessment and Plan:  Pregnancy: G3P2002 at [redacted]w[redacted]d 1. Encounter for supervision of low-risk pregnancy in third trimester - Doing well - IOL scheduled for 41 weeks - labs reviewed from recent MAU visit, normotensive today - Pre-eclampsia precautions discussed   2. [redacted] weeks gestation of pregnancy  Term labor symptoms and general obstetric precautions including but not limited to vaginal bleeding, contractions, leaking of fluid and fetal  movement were reviewed in detail with the patient. Please refer to After Visit Summary for other counseling recommendations.   Return in about 1 week (around 05/17/2020) for LOB, NST/BPP, In-Person.  Future Appointments  Date Time Provider Department Center  05/16/2020  1:00 PM Sheila Oats, MD [facility]    Vonzella Nipple, PA-C

## 2020-05-11 ENCOUNTER — Other Ambulatory Visit: Payer: Self-pay

## 2020-05-11 ENCOUNTER — Encounter (HOSPITAL_COMMUNITY): Payer: Self-pay | Admitting: Obstetrics & Gynecology

## 2020-05-11 ENCOUNTER — Inpatient Hospital Stay (HOSPITAL_COMMUNITY): Payer: Medicaid Other | Admitting: Anesthesiology

## 2020-05-11 ENCOUNTER — Inpatient Hospital Stay (HOSPITAL_COMMUNITY)
Admission: AD | Admit: 2020-05-11 | Discharge: 2020-05-12 | DRG: 807 | Disposition: A | Discharge status: Home / Self Care | Payer: Medicaid Other | Attending: Obstetrics & Gynecology | Admitting: Obstetrics & Gynecology

## 2020-05-11 DIAGNOSIS — Z283 Underimmunization status: Secondary | ICD-10-CM

## 2020-05-11 DIAGNOSIS — Z8616 Personal history of COVID-19: Secondary | ICD-10-CM

## 2020-05-11 DIAGNOSIS — D563 Thalassemia minor: Secondary | ICD-10-CM | POA: Diagnosis not present

## 2020-05-11 DIAGNOSIS — Z20822 Contact with and (suspected) exposure to covid-19: Secondary | ICD-10-CM | POA: Diagnosis not present

## 2020-05-11 DIAGNOSIS — O09899 Supervision of other high risk pregnancies, unspecified trimester: Secondary | ICD-10-CM

## 2020-05-11 DIAGNOSIS — Z8742 Personal history of other diseases of the female genital tract: Secondary | ICD-10-CM

## 2020-05-11 DIAGNOSIS — Z3A39 39 weeks gestation of pregnancy: Secondary | ICD-10-CM

## 2020-05-11 DIAGNOSIS — O26893 Other specified pregnancy related conditions, third trimester: Secondary | ICD-10-CM | POA: Diagnosis not present

## 2020-05-11 LAB — RPR: RPR Ser Ql: NONREACTIVE

## 2020-05-11 LAB — CBC
HCT: 36 % (ref 36.0–46.0)
Hemoglobin: 11.4 g/dL — ABNORMAL LOW (ref 12.0–15.0)
MCH: 27 pg (ref 26.0–34.0)
MCHC: 31.7 g/dL (ref 30.0–36.0)
MCV: 85.3 fL (ref 80.0–100.0)
Platelets: 153 10*3/uL (ref 150–400)
RBC: 4.22 MIL/uL (ref 3.87–5.11)
RDW: 12.7 % (ref 11.5–15.5)
WBC: 12.2 10*3/uL — ABNORMAL HIGH (ref 4.0–10.5)
nRBC: 0 % (ref 0.0–0.2)

## 2020-05-11 LAB — TYPE AND SCREEN
ABO/RH(D): A POS
Antibody Screen: NEGATIVE

## 2020-05-11 LAB — SARS CORONAVIRUS 2 BY RT PCR (HOSPITAL ORDER, PERFORMED IN ~~LOC~~ HOSPITAL LAB): SARS Coronavirus 2: NEGATIVE

## 2020-05-11 MED ORDER — SOD CITRATE-CITRIC ACID 500-334 MG/5ML PO SOLN: 30.0000 mL | ORAL | Status: DC | PRN

## 2020-05-11 MED ORDER — ONDANSETRON HCL 4 MG/2ML IJ SOLN: 4.0000 mg | Freq: Four times a day (QID) | INTRAMUSCULAR | Status: DC | PRN

## 2020-05-11 MED ORDER — FENTANYL CITRATE (PF) 100 MCG/2ML IJ SOLN: 100.0000 ug | INTRAMUSCULAR | Status: DC | PRN

## 2020-05-11 MED ORDER — PHENYLEPHRINE 40 MCG/ML (10ML) SYRINGE FOR IV PUSH (FOR BLOOD PRESSURE SUPPORT): 80.0000 ug | PREFILLED_SYRINGE | INTRAVENOUS | Status: DC | PRN

## 2020-05-11 MED ORDER — OXYTOCIN-SODIUM CHLORIDE 30-0.9 UT/500ML-% IV SOLN
2.5000 [IU]/h | INTRAVENOUS | Status: DC
  Administered 2020-05-11: 2.5 [IU]/h via INTRAVENOUS
  Filled 2020-05-11: qty 500

## 2020-05-11 MED ORDER — ACETAMINOPHEN 325 MG PO TABS: 650.0000 mg | ORAL_TABLET | ORAL | Status: DC | PRN

## 2020-05-11 MED ORDER — ONDANSETRON HCL 4 MG/2ML IJ SOLN: 4.0000 mg | INTRAMUSCULAR | Status: DC | PRN

## 2020-05-11 MED ORDER — MEASLES, MUMPS & RUBELLA VAC IJ SOLR
0.5000 mL | Freq: Once | INTRAMUSCULAR | Status: DC
  Filled 2020-05-11: qty 0.5

## 2020-05-11 MED ORDER — BENZOCAINE-MENTHOL 20-0.5 % EX AERO: 1.0000 "application " | INHALATION_SPRAY | CUTANEOUS | Status: DC | PRN

## 2020-05-11 MED ORDER — PRENATAL MULTIVITAMIN CH
1.0000 | ORAL_TABLET | Freq: Every day | ORAL | Status: DC
  Administered 2020-05-11 – 2020-05-12 (×2): 1 via ORAL
  Filled 2020-05-11 (×2): qty 1

## 2020-05-11 MED ORDER — ACETAMINOPHEN 325 MG PO TABS
650.0000 mg | ORAL_TABLET | ORAL | Status: DC | PRN
  Administered 2020-05-11: 650 mg via ORAL
  Filled 2020-05-11: qty 2

## 2020-05-11 MED ORDER — DIBUCAINE (PERIANAL) 1 % EX OINT: 1.0000 "application " | TOPICAL_OINTMENT | CUTANEOUS | Status: DC | PRN

## 2020-05-11 MED ORDER — OXYCODONE-ACETAMINOPHEN 5-325 MG PO TABS: 2.0000 | ORAL_TABLET | ORAL | Status: DC | PRN

## 2020-05-11 MED ORDER — EPHEDRINE 5 MG/ML INJ: 10.0000 mg | INTRAVENOUS | Status: DC | PRN

## 2020-05-11 MED ORDER — LIDOCAINE HCL (PF) 1 % IJ SOLN
INTRAMUSCULAR | Status: DC | PRN
  Administered 2020-05-11: 12 mL via EPIDURAL

## 2020-05-11 MED ORDER — WITCH HAZEL-GLYCERIN EX PADS: 1.0000 "application " | MEDICATED_PAD | CUTANEOUS | Status: DC | PRN

## 2020-05-11 MED ORDER — LACTATED RINGERS IV SOLN
500.0000 mL | Freq: Once | INTRAVENOUS | Status: AC
  Administered 2020-05-11: 500 mL via INTRAVENOUS

## 2020-05-11 MED ORDER — DIPHENHYDRAMINE HCL 50 MG/ML IJ SOLN: 12.5000 mg | INTRAMUSCULAR | Status: DC | PRN

## 2020-05-11 MED ORDER — IBUPROFEN 600 MG PO TABS
600.0000 mg | ORAL_TABLET | Freq: Four times a day (QID) | ORAL | Status: DC
  Administered 2020-05-11 – 2020-05-12 (×6): 600 mg via ORAL
  Filled 2020-05-11 (×6): qty 1

## 2020-05-11 MED ORDER — DIPHENHYDRAMINE HCL 25 MG PO CAPS: 25.0000 mg | ORAL_CAPSULE | Freq: Four times a day (QID) | ORAL | Status: DC | PRN

## 2020-05-11 MED ORDER — OXYCODONE-ACETAMINOPHEN 5-325 MG PO TABS: 1.0000 | ORAL_TABLET | ORAL | Status: DC | PRN

## 2020-05-11 MED ORDER — LACTATED RINGERS IV SOLN: 500.0000 mL | INTRAVENOUS | Status: DC | PRN

## 2020-05-11 MED ORDER — SIMETHICONE 80 MG PO CHEW: 80.0000 mg | CHEWABLE_TABLET | ORAL | Status: DC | PRN

## 2020-05-11 MED ORDER — LIDOCAINE HCL (PF) 1 % IJ SOLN: 30.0000 mL | INTRAMUSCULAR | Status: DC | PRN

## 2020-05-11 MED ORDER — LACTATED RINGERS IV SOLN: INTRAVENOUS | Status: DC

## 2020-05-11 MED ORDER — SENNOSIDES-DOCUSATE SODIUM 8.6-50 MG PO TABS
2.0000 | ORAL_TABLET | ORAL | Status: DC
  Administered 2020-05-11: 2 via ORAL
  Filled 2020-05-11: qty 2

## 2020-05-11 MED ORDER — FENTANYL-BUPIVACAINE-NACL 0.5-0.125-0.9 MG/250ML-% EP SOLN
12.0000 mL/h | EPIDURAL | Status: DC | PRN
  Filled 2020-05-11: qty 250

## 2020-05-11 MED ORDER — OXYCODONE HCL 5 MG PO TABS: 5.0000 mg | ORAL_TABLET | ORAL | Status: DC | PRN

## 2020-05-11 MED ORDER — SODIUM CHLORIDE (PF) 0.9 % IJ SOLN
INTRAMUSCULAR | Status: DC | PRN
  Administered 2020-05-11: 12 mL/h via EPIDURAL

## 2020-05-11 MED ORDER — PHENYLEPHRINE 40 MCG/ML (10ML) SYRINGE FOR IV PUSH (FOR BLOOD PRESSURE SUPPORT)
80.0000 ug | PREFILLED_SYRINGE | INTRAVENOUS | Status: DC | PRN
  Administered 2020-05-11: 80 ug via INTRAVENOUS
  Filled 2020-05-11: qty 10

## 2020-05-11 MED ORDER — ONDANSETRON HCL 4 MG PO TABS: 4.0000 mg | ORAL_TABLET | ORAL | Status: DC | PRN

## 2020-05-11 MED ORDER — OXYTOCIN BOLUS FROM INFUSION
333.0000 mL | Freq: Once | INTRAVENOUS | Status: AC
  Administered 2020-05-11: 333 mL via INTRAVENOUS

## 2020-05-11 MED ORDER — COCONUT OIL OIL: 1.0000 "application " | TOPICAL_OIL | Status: DC | PRN

## 2020-05-11 MED ORDER — ZOLPIDEM TARTRATE 5 MG PO TABS: 5.0000 mg | ORAL_TABLET | Freq: Every evening | ORAL | Status: DC | PRN

## 2020-05-11 MED ORDER — FLEET ENEMA 7-19 GM/118ML RE ENEM: 1.0000 | ENEMA | RECTAL | Status: DC | PRN

## 2020-05-11 MED ORDER — TETANUS-DIPHTH-ACELL PERTUSSIS 5-2.5-18.5 LF-MCG/0.5 IM SUSP: 0.5000 mL | Freq: Once | INTRAMUSCULAR | Status: DC

## 2020-05-11 NOTE — Plan of Care (Signed)
L&D careplan completed 

## 2020-05-11 NOTE — Anesthesia Postprocedure Evaluation (Signed)
Anesthesia Post Note  Patient: Patricia Villanueva  Procedure(s) Performed: AN AD HOC LABOR EPIDURAL     Patient location during evaluation: Mother Baby Anesthesia Type: Epidural Level of consciousness: awake Pain management: satisfactory to patient Vital Signs Assessment: post-procedure vital signs reviewed and stable Respiratory status: spontaneous breathing Cardiovascular status: stable Anesthetic complications: no   No complications documented.  Last Vitals:  Vitals:   05/11/20 0613 05/11/20 1013  BP: 136/69 (!) 143/80  Pulse: 66   Resp: 20 18  Temp: 36.8 C 36.8 C  SpO2: 100%     Last Pain:  Vitals:   05/11/20 1013  TempSrc: Oral  PainSc: 2    Pain Goal:                   [employer]

## 2020-05-11 NOTE — Discharge Summary (Signed)
Postpartum Discharge Summary  Date of Service updated 05/12/20     Patient Name: Patricia Villanueva DOB: 1990/10/20 MRN: [id number]  Date of admission: 05/11/2020 Delivery date:05/11/2020  Delivering provider: Langley Gauss A  Date of discharge: 05/12/2020  Admitting diagnosis: Normal labor and delivery [O80] Intrauterine pregnancy: [redacted]w[redacted]d    Secondary diagnosis:  Active Problems:   Rubella non-immune status, antepartum   Alpha thalassemia silent carrier   Normal labor and delivery  Additional problems: none    Discharge diagnosis: Term Pregnancy Delivered                                              Post partum procedures:MMR vax  Augmentation: none Complications: None  Hospital course: Onset of Labor With Vaginal Delivery      29y.o. yo G3P2002 at 349w2das admitted in Active Labor on 05/11/2020. Patient had an uncomplicated labor course, including an epidural placement at 7-8cm and then progressing to [place] [facility] Ru[place] Time/Date: 4:14 AM ,05/11/2020   Delivery Method:Vaginal, Spontaneous  Episiotomy: None  Lacerations:  None  Patient had an uncomplicated postpartum course.  She is ambulating, tolerating a regular diet, passing flatus, and urinating well. Patient is discharged home in stable condition on 05/12/20.  Newborn Data: Birth date:05/11/2020  Birth time:4:15 AM  Gender:Female  Living status:Living  Apgars:9 ,9  Weight:3459 g  3459gm (7lb 10oz)  Magnesium Sulfate received: No BMZ received: No Rhophylac:N/A MMR:Yes T-DaP:Given prenatally Flu: No Transfusion:No  Physical exam  Vitals:   05/11/20 2040 05/11/20 2345 05/12/20 0543 05/12/20 0725  BP: 123/81 128/86 136/90 (!) 143/75  Pulse: 62  62   Resp: _0 Temp: 98.3 F (36.8 C) 98.1 F (36.7 C) 98.9 F (37.2 C)   TempSrc: Oral Oral Oral   SpO2: 98%     Weight:      Height:       General: alert, cooperative and no distress Lochia: appropriate Uterine Fundus: firm Incision: N/A DVT  Evaluation: No evidence of DVT seen on physical exam. Labs: Lab Results  Component Value Date   WBC 12.2 (H) 05/11/2020   HGB 11.4 (L) 05/11/2020   HCT 36.0 05/11/2020   MCV 85.3 05/11/2020   PLT 153 05/11/2020   CMP Latest Ref Rng & Units 05/06/2020  Glucose 70 - 99 mg/dL 82  BUN 6 - 20 mg/dL 6  Creatinine 0.44 - 1.00 mg/dL 0.54  Sodium 135 - 145 mmol/L 138  Potassium 3.5 - 5.1 mmol/L 3.6  Chloride 98 - 111 mmol/L 108  CO2 22 - 32 mmol/L 21(L)  Calcium 8.9 - 10.3 mg/dL 8.6(L)  Total Protein 6.5 - 8.1 g/dL 6.4(L)  Total Bilirubin 0.3 - 1.2 mg/dL 0.5  Alkaline Phos 38 - 126 U/L 127(H)  AST 15 - 41 U/L 15  ALT 0 - 44 U/L 10   Edinburgh Score: Edinburgh Postnatal Depression Scale Screening Tool 05/11/2020  I have been able to laugh and see the funny side of things. 0  I have looked forward with enjoyment to things. 0  I have blamed myself unnecessarily when things went wrong. 1  I have been anxious or worried for no good reason. 1  I have felt scared or panicky for no good reason. 1  Things have been getting on top of me. 0  I have been  so unhappy that I have had difficulty sleeping. 0  I have felt sad or miserable. 0  I have been so unhappy that I have been crying. 0  The thought of harming myself has occurred to me. 0  Edinburgh Postnatal Depression Scale Total 3     After visit meds:  Allergies as of 05/12/2020   No Known Allergies     Medication List    TAKE these medications   amLODipine 5 MG tablet Commonly known as: NORVASC Take 1 tablet (5 mg total) by mouth daily.   Blood Pressure Monitoring Devi 1 Device by Does not apply route once a week.   ibuprofen 600 MG tablet Commonly known as: ADVIL Take 1 tablet (600 mg total) by mouth every 6 (six) hours.   prenatal multivitamin Tabs tablet Take 1 tablet by mouth daily at 12 noon.   Vitafol Gummies 3.33-0.333-34.8 MG Chew Chew 3 each by mouth daily.        Discharge home in stable condition Infant  Feeding: Bottle Infant Disposition:home with mother Discharge instruction: per After Visit Summary and Postpartum booklet. Activity: Advance as tolerated. Pelvic rest for 6 weeks.  Diet: routine diet Future Appointments: Future Appointments  Date Time Provider Dennis  06/13/2020 10:55 AM Lajean Manes, CNM [facility]   Follow up Visit:  [place] [facility] Follow up.   Specialty: Obstetrics and Gynecology Why: In 1 week for blood pressure check. Visit can be virtual if you have a home blood pressure cuff.  Contact information: [address] [place] [address] [phone]              Myrtis Ser, CNM  P [facility] Admin Pool Please schedule this patient for Postpartum visit in: 4 weeks with the following provider: Any provider  In-Person  For C/S patients schedule nurse incision check in weeks 2 weeks: no  Low risk pregnancy complicated by: rubella nonimmune  Delivery mode: SVD  Anticipated Birth Control: Nexplanon (unsure if inpt or not)  PP Procedures needed: none  Schedule Integrated BH visit: no    05/12/2020 Fatima Blank, CNM

## 2020-05-11 NOTE — Anesthesia Procedure Notes (Signed)
Epidural Patient location during procedure: OB Start time: 05/11/2020 2:55 AM End time: 05/11/2020 3:05 AM  Staffing Anesthesiologist: Elmer Picker, MD Performed: anesthesiologist   Preanesthetic Checklist Completed: patient identified, IV checked, risks and benefits discussed, monitors and equipment checked, pre-op evaluation and timeout performed  Epidural Patient position: sitting Prep: DuraPrep and site prepped and draped Patient monitoring: continuous pulse ox, blood pressure, heart rate and cardiac monitor Approach: midline Location: L3-L4 Injection technique: LOR air  Needle:  Needle type: Tuohy  Needle gauge: 17 G Needle length: 9 cm Needle insertion depth: 6 cm Catheter type: closed end flexible Catheter size: 19 Gauge Catheter at skin depth: 12 cm Test dose: negative  Assessment Sensory level: T8 Events: blood not aspirated, injection not painful, no injection resistance, no paresthesia and negative IV test  Additional Notes Patient identified. Risks/Benefits/Options discussed with patient including but not limited to bleeding, infection, nerve damage, paralysis, failed block, incomplete pain control, headache, blood pressure changes, nausea, vomiting, reactions to medication both or allergic, itching and postpartum back pain. Confirmed with bedside nurse the patient's most recent platelet count. Confirmed with patient that they are not currently taking any anticoagulation, have any bleeding history or any family history of bleeding disorders. Patient expressed understanding and wished to proceed. All questions were answered. Sterile technique was used throughout the entire procedure. Please see nursing notes for vital signs. Test dose was given through epidural catheter and negative prior to continuing to dose epidural or start infusion. Warning signs of high block given to the patient including shortness of breath, tingling/numbness in hands, complete motor block,  or any concerning symptoms with instructions to call for help. Patient was given instructions on fall risk and not to get out of bed. All questions and concerns addressed with instructions to call with any issues or inadequate analgesia.  Reason for block:procedure for pain

## 2020-05-11 NOTE — MAU Note (Signed)
covid swab obtained without difficulty and pt tol well. No symptoms °

## 2020-05-11 NOTE — Anesthesia Preprocedure Evaluation (Signed)

## 2020-05-11 NOTE — H&P (Signed)
Patricia Villanueva is a 29 y.o. female G3P2002 at 39.2wks by 7wk scan presenting for reg ctx and vag d/c. Denies bleeding. No N/V, H/A or visual disturbances. Her preg has been followed by the [place] office and has been remarkable for:  * rubella nonimmune * silent carrier alpha thal  OB History    Gravida  3   Para  2   Term  2   Preterm  0   AB  0   Living  2     SAB  0   TAB  0   Ectopic  0   Multiple  0   Live Births  2          Past Medical History:  Diagnosis Date  . Chlamydia   . Medical history non-contributory    Past Surgical History:  Procedure Laterality Date  . arm surgery     left arm surgery to repair a nerve  . arm surgery    . DENTAL SURGERY    . NERVE EXPLORATION  04/04/2012   Procedure: NERVE EXPLORATION;  Surgeon: Linna Hoff, MD;  Location: [place];  Service: Orthopedics;  Laterality: Left;  Left Arm Laceration with Nerve Repair   Family History: family history includes Hypertension in her mother. Social History:  reports that she has never smoked. She has never used smokeless tobacco. She reports previous alcohol use. She reports previous drug use. Drug: Marijuana.     Maternal Diabetes: No Genetic Screening: Normal Maternal Ultrasounds/Referrals: Normal Fetal Ultrasounds or other Referrals:  None Maternal Substance Abuse:  No Significant Maternal Medications:  None Significant Maternal Lab Results:  Group B Strep negative Other Comments:  None  Review of Systems History Dilation: 5 Effacement (%): 70 Station: -2 Exam by:: Therisa Doyne, RN unknown if currently breastfeeding. Exam Physical Exam Constitutional:      Appearance: Normal appearance.  HENT:     Head: Normocephalic.     Nose: Nose normal.     Mouth/Throat:     Mouth: Mucous membranes are moist.  Cardiovascular:     Rate and Rhythm: Normal rate.  Pulmonary:     Effort: Pulmonary effort is normal.  Abdominal:     Comments: FHR 140s, +accels, occ mi  variables Ctx q 4-5 mins  Musculoskeletal:        General: Normal range of motion.     Cervical back: Normal range of motion.  Skin:    General: Skin is warm and dry.  Neurological:     General: No focal deficit present.     Mental Status: She is alert and oriented to person, place, and time.  Psychiatric:        Mood and Affect: Mood normal.        Thought Content: Thought content normal.     Prenatal labs: ABO, Rh: A/Positive/-- (02/01 1437) Antibody: Negative (02/01 1437) Rubella: <0.90 (02/01 1437) RPR: Non Reactive (05/25 0902)  HBsAg: Negative (02/01 1437)  HIV: Non Reactive (05/25 0902)  GBS: Negative/-- (07/26 1602)   Assessment/Plan: IUP@term  Early active labor GBS neg  Admit to Labor and Delivery Expectant management Anticipate vag del Plans for BTL vs Nexplanon Rec MMR vax pp   Myrtis Ser CNM 05/11/2020, 2:12 AM

## 2020-05-11 NOTE — Discharge Instructions (Signed)

## 2020-05-11 NOTE — MAU Note (Signed)
..  Patricia Villanueva is a 29 y.o. at [redacted]w[redacted]d here in MAU reporting: CTX every "minute". Pt denies vaginal bleeding. +FM. Pt reports some leaking of fluid.

## 2020-05-12 MED ORDER — AMLODIPINE BESYLATE 5 MG PO TABS: 5.0000 mg | ORAL_TABLET | Freq: Every day | ORAL | 1 refills | Status: DC

## 2020-05-12 MED ORDER — IBUPROFEN 600 MG PO TABS: 600.0000 mg | ORAL_TABLET | Freq: Four times a day (QID) | ORAL | 0 refills | Status: DC

## 2020-05-12 MED ORDER — PRENATAL MULTIVITAMIN CH: 1.0000 | ORAL_TABLET | Freq: Every day | ORAL | 3 refills | Status: DC

## 2020-05-12 MED ORDER — AMLODIPINE BESYLATE 5 MG PO TABS
5.0000 mg | ORAL_TABLET | Freq: Every day | ORAL | Status: DC
  Administered 2020-05-12: 5 mg via ORAL
  Filled 2020-05-12: qty 1

## 2020-05-12 MED FILL — IBUPROFEN 600 MG TABLET: 600 | 7 days supply | Qty: 30 | Fill #0

## 2020-05-12 MED FILL — AMLODIPINE BESYLATE 5 MG TA: 5 | 30 days supply | Qty: 30 | Fill #0

## 2020-05-12 NOTE — Progress Notes (Signed)
CSW received consult for hx of marijuana use. CSW unable to find documentation indicating patient used substances during pregnancy. Therefore, CSW screening out referral and will not be following CDS results.  Please consult CSW if current concerns arise or by MOB's request.  Lear Ng, LCSW [facility] [phone]

## 2020-05-15 ENCOUNTER — Telehealth: Payer: Self-pay | Admitting: *Deleted

## 2020-05-15 NOTE — Telephone Encounter (Signed)
Attempted to contact patient to complete transition of care assessment. Message states "sorry mailbox is full". Unable to leave message.  Burnard Bunting, RN, BSN, CCRN [facility] [phone]

## 2020-05-15 NOTE — Telephone Encounter (Signed)
Email sent to  Internal Medicine to  request  NP appointment . °  °                             Patricia Villanueva °                                 PEC °                             [phone]       ° °

## 2020-05-16 ENCOUNTER — Encounter: Payer: Medicaid Other | Admitting: Obstetrics and Gynecology

## 2020-05-17 ENCOUNTER — Telehealth: Payer: Self-pay | Admitting: *Deleted

## 2020-05-17 NOTE — Telephone Encounter (Signed)
Contacted patient to complete transition of care assessment:  Transition Care Management Follow-up Telephone Call  . Medicaid Managed Care Transition Call Status:MM [facility] Call Made  . Date of discharge and from where: [facility], 05/12/20  . How have you been since you were released from the hospital? "ok"  . Any questions or concerns? No  Items Reviewed: Marland Kitchen Did the pt receive and understand the discharge instructions provided? Yes  . Medications obtained and verified? Yes  . Any new allergies since your discharge? No  . Dietary orders reviewed? No . Do you have support at home?  Yes, family  Functional Questionnaire: (I = Independent and D = Dependent)  ADLs: Independent Bathing/Dressing:Independent Meal Prep: Independent Eating: Independent Maintaining continence: Independent Transferring/Ambulation: Independent Managing Meds: Independent   Follow up appointments reviewed:  PCP Hospital f/u appt confirmed? No  Patient would like to be assigned a PCP. The patient states she has pregnancy [facility] f/u appt confirmed?  Scheduled to see Steward Drone, CNM 06/13/20 on @ 1055  Are transportation arrangements needed? No   If their condition worsens, is the pt aware to call PCP or go to the EmergencyDept.? Yes Was the patient provided with contact information for the PCP's office or ED? yes  Was to pt encouraged to call back with questions or concerns? Yes  Burnard Bunting, RN, BSN, CCRN [facility] [phone]

## 2020-05-19 ENCOUNTER — Ambulatory Visit: Payer: Medicaid Other

## 2020-05-23 ENCOUNTER — Inpatient Hospital Stay (HOSPITAL_COMMUNITY)
Admission: AD | Admit: 2020-05-23 | Payer: Medicaid Other | Source: Home / Self Care | Admitting: Obstetrics & Gynecology

## 2020-05-23 ENCOUNTER — Inpatient Hospital Stay (HOSPITAL_COMMUNITY): Payer: Medicaid Other

## 2020-06-08 ENCOUNTER — Encounter: Payer: Self-pay | Admitting: General Practice

## 2020-06-13 ENCOUNTER — Ambulatory Visit: Payer: Medicaid Other | Admitting: Certified Nurse Midwife

## 2020-06-13 ENCOUNTER — Encounter: Payer: Self-pay | Admitting: Certified Nurse Midwife

## 2020-06-27 ENCOUNTER — Ambulatory Visit (INDEPENDENT_AMBULATORY_CARE_PROVIDER_SITE_OTHER): Payer: Medicaid Other | Admitting: Certified Nurse Midwife

## 2020-06-27 ENCOUNTER — Encounter: Payer: Self-pay | Admitting: Certified Nurse Midwife

## 2020-06-27 ENCOUNTER — Other Ambulatory Visit: Payer: Self-pay

## 2020-06-27 DIAGNOSIS — F3289 Other specified depressive episodes: Secondary | ICD-10-CM

## 2020-06-27 DIAGNOSIS — O99345 Other mental disorders complicating the puerperium: Secondary | ICD-10-CM

## 2020-06-27 DIAGNOSIS — O165 Unspecified maternal hypertension, complicating the puerperium: Secondary | ICD-10-CM

## 2020-06-27 LAB — POCT PREGNANCY, URINE: Preg Test, Ur: NEGATIVE

## 2020-06-27 MED ORDER — AMLODIPINE BESYLATE 5 MG PO TABS: 5.0000 mg | ORAL_TABLET | Freq: Every day | ORAL | 1 refills | Status: DC

## 2020-06-27 MED ORDER — MEDROXYPROGESTERONE ACETATE 150 MG/ML IM SUSP
150.0000 mg | Freq: Once | INTRAMUSCULAR | Status: AC
Start: 2020-06-27 — End: 2020-06-27
  Administered 2020-06-27: 150 mg via INTRAMUSCULAR

## 2020-06-27 NOTE — Progress Notes (Signed)
Post Partum Visit Note  Patricia Villanueva is a 29 y.o. G28P3003 female who presents for a postpartum visit. She is 7 weeks postpartum following a normal spontaneous vaginal delivery.  I have fully reviewed the prenatal and intrapartum course. The delivery was at 39 gestational weeks.  Anesthesia: epidural. Postpartum course has been uncomplicated. Baby is doing well. Baby is feeding by bottle - [employer] Good Start. Bleeding no bleeding. Bowel function is normal. Bladder function is normal. Patient is sexually active. Contraception method is condoms. Postpartum depression screening: negative. Has resumed intercourse using condoms last about 7 days ago. phq9 at 9 , discussed bhc and she would like to see BHc. Referral ordered.    The pregnancy intention screening data noted above was reviewed. Potential methods of contraception were discussed. The patient elected to proceed with Hormonal Injection.    Edinburgh Postnatal Depression Scale - 06/27/20 1113      Edinburgh Postnatal Depression Scale:  In the Past 7 Days   I have been able to laugh and see the funny side of things. 1    I have looked forward with enjoyment to things. 0    I have blamed myself unnecessarily when things went wrong. 2    I have been anxious or worried for no good reason. 2    I have felt scared or panicky for no good reason. 0    Things have been getting on top of me. 2    I have been so unhappy that I have had difficulty sleeping. 0    I have felt sad or miserable. 1    I have been so unhappy that I have been crying. 1    The thought of harming myself has occurred to me. 0    Edinburgh Postnatal Depression Scale Total 9          The following portions of the patient's history were reviewed and updated as appropriate: allergies, current medications, past family history, past medical history, past social history, past surgical history and problem list.  Review of Systems Pertinent items noted in HPI and remainder of  comprehensive ROS otherwise negative.    Objective:  Blood pressure (!) 140/92, pulse 96, height 5\' 8"  (1.727 m), weight 204 lb 9.6 oz (92.8 kg), last menstrual period 06/23/2020, not currently breastfeeding.  General:  alert, cooperative, appears stated age and no distress   Breasts:  negative  Lungs: clear to auscultation bilaterally  Heart:  regular rate and rhythm  Abdomen: soft, non-tender; bowel sounds normal; no masses,  no organomegaly   Vulva:  not evaluated  Vagina: not evaluated  Cervix:  not evaluated  Corpus: normal  Adnexa:  normal adnexa  Rectal Exam: Not performed.        Assessment:    Normal postpartum exam. Pap smear not done at today's visit.   Plan:   Essential components of care per ACOG recommendations:  1.  Mood and well being: Patient with positive depression screening today. Reviewed local resources for support.  - Patient does not use tobacco.  - hx of drug use? No   2. Infant care and feeding:  -Patient currently breastmilk feeding? No  -Social determinants of health (SDOH) reviewed in EPIC. No concerns  3. Sexuality, contraception and birth spacing - Patient does not want a pregnancy in the next year.  Desired family size is 3 children.  - Reviewed forms of contraception in tiered fashion. Patient desired Depo-Provera today.   - Discussed birth  spacing of 18 months  4. Sleep and fatigue -Encouraged family/partner/community support of 4 hrs of uninterrupted sleep to help with mood and fatigue  5. Physical Recovery  - Discussed patients delivery and complications - Patient had no laceration, perineal healing reviewed. Patient expressed understanding - Patient has urinary incontinence? No - Patient is safe to resume physical and sexual activity - Ran out of her norvasc prescription but has been having headaches again  6.  Health Maintenance - Last pap smear done 11/01/19 and was normal with negative HPV. - Mammogram not indicated  7. No  Chronic Disease - PCP follow up for BP management - Norvasc refilled for another month  Edd Arbour, CNM, MSN, [facility] 06/27/20 2:39 PM

## 2020-07-12 NOTE — BH Specialist Note (Deleted)
Integrated Behavioral Health via Telemedicine Video (Caregility) Visit  07/12/2020 Patricia Villanueva [id number]  Number of Integrated Behavioral Health visits: 1 Session Start time: 2:15***  Session End time: 3:15*** Total time: {IBH Total Time:21014050} minutes  Referring Provider: Edd Arbour, CNM Type of Service: Individual, Family, *** Patient/Family location: Home [facility] Provider location: [facility] [employer] [facility]  All persons participating in visit:Patient *** and [name] ***    I connected with Franki Cabot and/or Mekenzie T Quast's {family members:20773} by a video enabled telemedicine application (Caregility) and verified that I am speaking with the correct person using two identifiers.   Discussed confidentiality: {YES/NO:21197}  Confirmed demographics & insurance:  {YES/NO:21197}  I discussed that engaging in this virtual visit, they consent to the provision of behavioral healthcare and the services will be billed under their insurance.   Patient and/or legal guardian expressed understanding and consented to virtual visit: {YES/NO:21197}  PRESENTING CONCERNS: Patient and/or family reports the following symptoms/concerns: *** Duration of problem: ***; Severity of problem: {Mild/Moderate/Severe:20260}  STRENGTHS (Protective Factors/Coping Skills): {CHL AMB BH PROTECTIVE FACTORS/STRENGTHS:[phone]}  ASSESSMENT: Patient currently experiencing ***.    GOALS ADDRESSED: Patient will: 1.  Reduce symptoms of: {IBH Symptoms:21014056}  2.  Increase knowledge and/or ability of: {IBH Patient Tools:21014057}  3.  Demonstrate ability to: {IBH Goals:21014053}   Progress of Goals: {CHL AMB BH PROGRESS TOWARDS GHWEX:9371696789}  INTERVENTIONS: Interventions utilized:  {IBH Interventions:21014054} Standardized Assessments completed & reviewed: {IBH Screening Tools:21014051}   OUTCOME: Patient Response: ***   PLAN: 1. Follow  up with behavioral health clinician on : *** 2. Behavioral recommendations: *** 3. Referral(s): {IBH Referrals:21014055}  I discussed the assessment and treatment plan with the patient and/or parent/guardian. They were provided an opportunity to ask questions and all were answered. They agreed with the plan and demonstrated an understanding of the instructions.   They were advised to call back or seek an in-person evaluation as appropriate.  I discussed that the purpose of this visit is to provide behavioral health care while limiting exposure to the novel coronavirus.  Discussed there is a possibility of technology failure and discussed alternative modes of communication if that failure occurs.  Valetta Close Carolyne Whitsel  Depression screen [facility] 2/9 05/10/2020 05/03/2020 04/24/2020 02/22/2020 01/12/2020  Decreased Interest 0 0 0 0 0  Down, Depressed, Hopeless 0 0 0 0 0  PHQ - 2 Score 0 0 0 0 0  Altered sleeping 0 0 0 0 0  Tired, decreased energy 0 0 0 1 0  Change in appetite 0 0 0 0 0  Feeling bad or failure about yourself  0 0 0 0 0  Trouble concentrating 0 0 0 0 0  Moving slowly or fidgety/restless 0 0 0 0 0  Suicidal thoughts 0 0 0 0 0  PHQ-9 Score 0 0 0 1 0   GAD 7 : Generalized Anxiety Score 06/27/2020 05/10/2020 05/03/2020 04/24/2020  Nervous, Anxious, on Edge 0 0 0 0  Control/stop worrying 1 0 0 0  Worry too much - different things 1 0 0 0  Trouble relaxing 1 0 0 0  Restless 0 0 0 0  Easily annoyed or irritable 2 0 0 0  Afraid - awful might happen 0 0 0 0  Total GAD 7 Score 5 0 0 0

## 2020-07-17 NOTE — BH Specialist Note (Deleted)
Integrated Behavioral Health via Telemedicine Video (Caregility) Visit  07/17/2020 [name] [id number]  Number of Integrated Behavioral Health visits: *** Session Start time: ***  Session End time: *** Total time: {IBH Total Time:21014050} minutes  Referring Provider: *** Type of Service: Individual, Family, *** Patient/Family location: *** [facility] Provider location: *** All persons participating in visit: ***   I connected with Patricia Villanueva and/or Patricia Villanueva's {family members:20773} by a video enabled telemedicine application (Caregility) and verified that I am speaking with the correct person using two identifiers.   Discussed confidentiality: {YES/NO:21197}  Confirmed demographics & insurance:  {YES/NO:21197}  I discussed that engaging in this virtual visit, they consent to the provision of behavioral healthcare and the services will be billed under their insurance.   Patient and/or legal guardian expressed understanding and consented to virtual visit: {YES/NO:21197}  PRESENTING CONCERNS: Patient and/or family reports the following symptoms/concerns: *** Duration of problem: ***; Severity of problem: {Mild/Moderate/Severe:20260}  STRENGTHS (Protective Factors/Coping Skills): {CHL AMB BH PROTECTIVE FACTORS/STRENGTHS:[phone]}  ASSESSMENT: Patient currently experiencing ***.    GOALS ADDRESSED: Patient will: 1.  Reduce symptoms of: {IBH Symptoms:21014056}  2.  Increase knowledge and/or ability of: {IBH Patient Tools:21014057}  3.  Demonstrate ability to: {IBH Goals:21014053}   Progress of Goals: {CHL AMB BH PROGRESS TOWARDS YQIHK:[id number]}  INTERVENTIONS: Interventions utilized:  {IBH Interventions:21014054} Standardized Assessments completed & reviewed: {IBH Screening Tools:21014051}   OUTCOME: Patient Response: ***   PLAN: 1. Follow up with behavioral health clinician on : *** 2. Behavioral recommendations: *** 3. Referral(s): {IBH  Referrals:21014055}  I discussed the assessment and treatment plan with the patient and/or parent/guardian. They were provided an opportunity to ask questions and all were answered. They agreed with the plan and demonstrated an understanding of the instructions.   They were advised to call back or seek an in-person evaluation as appropriate.  I discussed that the purpose of this visit is to provide behavioral health care while limiting exposure to the novel coronavirus.  Discussed there is a possibility of technology failure and discussed alternative modes of communication if that failure occurs.  Patricia Villanueva

## 2020-07-26 ENCOUNTER — Telehealth: Payer: Self-pay | Admitting: Clinical

## 2020-07-26 NOTE — Telephone Encounter (Cosign Needed)
Left HIPPA-compliant message to call back Asher Muir from [facility] [employer] [facility] at [phone] (main office) to reschedule appointment today.  Bea Graff (Supervisor: Hulda Marin)

## 2020-09-12 ENCOUNTER — Ambulatory Visit (INDEPENDENT_AMBULATORY_CARE_PROVIDER_SITE_OTHER): Payer: Medicaid Other | Admitting: Lactation Services

## 2020-09-12 ENCOUNTER — Other Ambulatory Visit: Payer: Self-pay

## 2020-09-12 ENCOUNTER — Ambulatory Visit: Payer: Medicaid Other

## 2020-09-12 DIAGNOSIS — Z3042 Encounter for surveillance of injectable contraceptive: Secondary | ICD-10-CM | POA: Diagnosis not present

## 2020-09-12 MED ORDER — MEDROXYPROGESTERONE ACETATE 150 MG/ML IM SUSP
150.0000 mg | Freq: Once | INTRAMUSCULAR | Status: AC
  Administered 2020-09-12: 16:00:00 150 mg via INTRAMUSCULAR

## 2020-09-12 NOTE — Progress Notes (Signed)
Patricia Villanueva here for Depo-Provera Injection. Injection administered without complication. Patient will return in 3 months for next injection between March 1 and December 12, 2020. Next annual visit due September 2022.   She reports she has stopped her Norvasc as it was causing severe headaches. Patient initial BP 153/98 with HR 83. Follow up after 15 minutes 152/97 with HR 85. She is having headaches 1-2 times a week she is taking Ibuprofen with good results. She is not having blurred vision or dizziness. She denies high BP prior to having last infant.  Spoke with Dr. Crissie Reese who approved her getting Depo injection and that she needs to follow up with PCP asap. [place] PCP list placed in her AVS and advised patient that she need to be seen by PCP ASAP for BP issues. Patient voiced understanding.   Patient with no further questions or concerns.   Ed Blalock, RN 09/12/2020  3:39 PM

## 2020-09-12 NOTE — Patient Instructions (Signed)
AREA FAMILY PRACTICE PHYSICIANS  [place] ([address]) . [facility] o [address] [place], [place] [address] o [phone] o Mon-Fri 8:30-12:30, 1:30-5:00 o Accepting Medicaid . [facility] o [address], [place], [place] [address] o [phone] o Mon-Fri 8:00-5:30 . [facility] o [address] [place], [place] [address] o [phone] o Mon, Tue, Thur, Fri 8:30-5:00, Wed 10:00-7:00 (closed 1-2pm) o Accepting Medicaid . [facility] o [address], [place], [place]  [address] o Phone - [phone]   Fax - [phone]  [place] ([address]) . [facility] o [address] [place], [place] [address] o [phone] o Mon-Fri 8:00-5:00 . [facility] [place] ([facility]  o [address] [place], [place] [address] o [phone] o Mon-Fri 8:30-5:30, Sat (Oct.-Mar.) 9:00-1:00 o Accepting Medicaid  [place] ([address]) . [facility] o [address], [place], [place] [address] o [phone] o Mon-Fri 8:00-5:00  [place] ([address]) . [facility] o [address], [place], [place] [address] o [phone] o Mon-Fri 8:00-5:00 . [facility] [place] o [address], [place], [place] [address] o [phone] o Mon-Fri 8:00-5:00 . [facility] [place] o [address] [place], [place] [address] o [phone] o Mon-Fri 8:00-5:00 . [facility] o [address] [place] [address] o [phone] o Mon-Fri 7:30-5:30  [place] ([address] & [address]) . [facility] o [address] [place], [place] [address] o [phone] o Mon-Thur 8:00-6:00 o Accepting Medicaid . [facility] o [address] [place], [place] [address] o [phone] o Mon-Thur 7:30-7:30, Fri 7:30-4:30 o Accepting Medicaid . [facility] [place] o [address], [place], [place]  [address] o [phone]   Fax - [phone]  [place] ([address] & [address]) .  [facility] [place] o [address] [place], [place] [address] o [phone] o Mon-Fri 7:00-5:00 . [facility] o [address], [place], [place] [address] o [phone] o Mon-Fri 8:00-5:00 o Accepting Medicaid . [facility] o [address], [place], [place] [address] o [phone] o Mon-Fri 8:00-5:00 o Accepting Medicaid  [place] ([address]) .  [facility] [place] o [address] [place], [place] [address] o [phone] o Mon-Fri 8:00-5:00 . [facility] o [address], [place], [place] [address] o [phone] o Mon-Fri 8:00-5:00 o Accepting Medicaid . [facility] o [address], [place], [place] [address] o [phone] o Mon-Fri 8:00-5:30, Sat&Sun by appointment (phones open at 8:30) o Accepting Medicaid  [place] ([address]) . [facility] o [address] [place], [place] [address] o [phone] o Mon-Thur 8:00-7:00, Fri 8:00-5:00, Sat 8:00-12:00, Sun 9:00-12:00 o Accepting Medicaid . [facility] [place] o [address], [place], [place] [address] o [phone] o Mon-Thur 8:00-5:00 o Accepting Medicaid . [facility] o [address] [place], [place] [address] o [phone] o Mon-Fri 8:00-5:30, Sat (Oct.-Mar.) 9:00-1:00 o Accepting Medicaid  [place]  ([address]) .   [facility] o [address], [place], [place] [address] o [phone] o Mon-Fri 8:00-5:00 o Accepting Medicaid   [place] ([address]) . [facility] [place] o [address], [place], [place] [address] o [phone] o Mon-Fri 8:00-5:00 . [facility] [place] o [address], [place], [place] [address] o [phone] o Mon-Fri 8:00-5:00 . [facility] [place] o [address] [place] [address], [place] [address] o [phone] o Mon-Fri 8:00-5:00 o After hours clinic ([address] [place], [place] [address]) [phone] Mon-Fri 5:00-8:00, Sat 12:00-6:00, Sun 10:00-4:00 o Accepting Medicaid . [facility] [place] o [address], [place], [place]  [address] o [phone]   Fax - [phone]  [place] ([address]) . [facility] [place] o [address], [place], [place] [address] o [phone] o Mon-Fri 8:00-5:00 . [facility] [place] ([facility] [place]) o [address], [place],  [address] o [phone] o Mon-Thur 8:00-7:00, Fri 8:00-5:00, Sat 8:00-12:00    

## 2020-09-14 NOTE — Progress Notes (Signed)
Chart reviewed for nurse visit. Agree with plan of care.   Marny Lowenstein, PA-C 09/14/2020 10:02 AM

## 2020-11-08 ENCOUNTER — Encounter (HOSPITAL_COMMUNITY): Payer: Self-pay

## 2020-11-08 ENCOUNTER — Emergency Department (HOSPITAL_COMMUNITY)
Admission: EM | Admit: 2020-11-08 | Discharge: 2020-11-08 | Disposition: A | Discharge status: Home / Self Care | Payer: Medicaid Other | Attending: Emergency Medicine | Admitting: Emergency Medicine

## 2020-11-08 ENCOUNTER — Other Ambulatory Visit: Payer: Self-pay

## 2020-11-08 DIAGNOSIS — Z20822 Contact with and (suspected) exposure to covid-19: Secondary | ICD-10-CM | POA: Insufficient documentation

## 2020-11-08 DIAGNOSIS — R42 Dizziness and giddiness: Secondary | ICD-10-CM | POA: Diagnosis not present

## 2020-11-08 DIAGNOSIS — E860 Dehydration: Secondary | ICD-10-CM | POA: Diagnosis not present

## 2020-11-08 DIAGNOSIS — R0602 Shortness of breath: Secondary | ICD-10-CM | POA: Diagnosis present

## 2020-11-08 LAB — BASIC METABOLIC PANEL
Anion gap: 10 (ref 5–15)
BUN: 12 mg/dL (ref 6–20)
CO2: 22 mmol/L (ref 22–32)
Calcium: 8.9 mg/dL (ref 8.9–10.3)
Chloride: 107 mmol/L (ref 98–111)
Creatinine, Ser: 0.86 mg/dL (ref 0.44–1.00)
GFR, Estimated: >60 mL/min (ref >60)
Glucose, Bld: 105 mg/dL — ABNORMAL HIGH (ref 70–99)
Potassium: 3.8 mmol/L (ref 3.5–5.1)
Sodium: 139 mmol/L (ref 135–145)

## 2020-11-08 LAB — CBC
HCT: 42.4 % (ref 36.0–46.0)
Hemoglobin: 12.8 g/dL (ref 12.0–15.0)
MCH: 26.3 pg (ref 26.0–34.0)
MCHC: 30.2 g/dL (ref 30.0–36.0)
MCV: 87.1 fL (ref 80.0–100.0)
Platelets: 164 10*3/uL (ref 150–400)
RBC: 4.87 MIL/uL (ref 3.87–5.11)
RDW: 13.3 % (ref 11.5–15.5)
WBC: 5.8 10*3/uL (ref 4.0–10.5)
nRBC: 0 % (ref 0.0–0.2)

## 2020-11-08 LAB — I-STAT BETA HCG BLOOD, ED (MC, WL, AP ONLY): I-stat hCG, quantitative: <5 m[IU]/mL (ref <5)

## 2020-11-08 LAB — SARS CORONAVIRUS 2 (TAT 6-24 HRS): SARS Coronavirus 2: NEGATIVE

## 2020-11-08 NOTE — ED Notes (Signed)
Patient ambulated independently with steady gait on room air. Maintained SpO2 of 100% through ambulation.

## 2020-11-08 NOTE — ED Triage Notes (Signed)
Pt reports dizziness, headache and sob that started a few days ago, exposed to someone at work that had COVID. Has not had any vaccines. resp e.u

## 2020-11-08 NOTE — ED Provider Notes (Signed)
[facility] EMERGENCY DEPARTMENT Provider Note   CSN: [id number] Arrival date & time: 11/08/20  9093     History Chief Complaint  Patient presents with  . Dizziness  . Shortness of Breath    Patricia Villanueva is a 30 y.o. female with possible history for hypercalcemia found.,  COVID-19 in 2021.  Did not have Covid vaccinations.  HPI Patient presents to emergency room today with chief complaint of dizziness and shortness of breath x3 days.  She states her symptoms are intermittent.  Her coworker tested positive for Covid last week.  She states she is here to have a Covid test and she does not have symptoms currently. She describes her dizziness as feeling lightheaded when she walks upstairs at work while carrying her equipment, usually first thing in the mornings. Her shortness of breath occurs at the same time. Patient states she does not typically eat breakfast and drinks 2 bottle of water per day. She works for a International aid/development worker. Denies any new products. No medications for symptoms prior to arrival. Denies fever, chills, headache, visual changes, syncope, palpitations, chest pain, diarrhea, blood in stool, black tarry stool. Currently on menstrual cycle, gets depo injection for birth control. Does not smoke.    Past Medical History:  Diagnosis Date  . Chlamydia   . Medical history non-contributory     Patient Active Problem List   Diagnosis Date Noted  . Alpha thalassemia silent carrier 11/11/2019  . History of COVID-19 10/08/2019  . History of irregular menstrual cycles 02/13/2013    Past Surgical History:  Procedure Laterality Date  . arm surgery     left arm surgery to repair a nerve  . arm surgery    . DENTAL SURGERY    . NERVE EXPLORATION  04/04/2012   Procedure: NERVE EXPLORATION;  Surgeon: Sharma Covert, MD;  Location: [facility] OR;  Service: Orthopedics;  Laterality: Left;  Left Arm Laceration with Nerve Repair     OB History    Gravida  3   Para  3    Term  3   Preterm  0   AB  0   Living  3     SAB  0   IAB  0   Ectopic  0   Multiple  0   Live Births  3           Family History  Problem Relation Age of Onset  . Hypertension Mother   . Alcohol abuse Neg Hx   . Arthritis Neg Hx   . Birth defects Neg Hx   . Asthma Neg Hx   . Cancer Neg Hx   . COPD Neg Hx   . Diabetes Neg Hx   . Depression Neg Hx   . Drug abuse Neg Hx   . Early death Neg Hx   . Hearing loss Neg Hx   . Heart disease Neg Hx   . Hyperlipidemia Neg Hx   . Kidney disease Neg Hx   . Learning disabilities Neg Hx   . Mental illness Neg Hx   . Mental retardation Neg Hx   . Miscarriages / Stillbirths Neg Hx   . Stroke Neg Hx   . Vision loss Neg Hx     Social History   Tobacco Use  . Smoking status: Never Smoker  . Smokeless tobacco: Never Used  Vaping Use  . Vaping Use: Never used  Substance Use Topics  . Alcohol use: Yes  Comment: occasionally  . Drug use: Not Currently    Types: Marijuana    Home Medications Prior to Admission medications   Medication Sig Start Date End Date Taking? Authorizing Provider  amLODipine (NORVASC) 5 MG tablet Take 1 tablet (5 mg total) by mouth daily. Patient not taking: Reported on 09/12/2020 06/27/20   Bernerd Limbo, CNM  Blood Pressure Monitoring DEVI 1 Device by Does not apply route once a week. 11/01/19   Burleson, Brand Males, NP  ibuprofen (ADVIL) 600 MG tablet Take 1 tablet (600 mg total) by mouth every 6 (six) hours. Patient not taking: Reported on 09/12/2020 05/12/20   [place], [place], MD  Prenatal Vit-Fe Fumarate-FA (PRENATAL MULTIVITAMIN) TABS tablet Take 1 tablet by mouth daily at 12 noon. Patient not taking: Reported on 09/12/2020 05/12/20   Roma Kayser, MD  Prenatal Vit-Fe Phos-FA-Omega (VITAFOL GUMMIES) 3.33-0.333-34.8 MG CHEW Chew 3 each by mouth daily. Patient not taking: Reported on 09/12/2020 11/01/19   Currie Paris, NP    Allergies    Patient has no known  allergies.  Review of Systems   Review of Systems All other systems are reviewed and are negative for acute change except as noted in the HPI.  Physical Exam Updated Vital Signs BP (!) 132/98 (BP Location: Right Arm)   Pulse 65   Temp 98.8 F (37.1 C) (Oral)   Resp 16   SpO2 100%   Physical Exam Vitals and nursing note reviewed.  Constitutional:      General: She is not in acute distress.    Appearance: She is not ill-appearing.  HENT:     Head: Normocephalic and atraumatic.     Right Ear: Tympanic membrane and external ear normal.     Left Ear: Tympanic membrane and external ear normal.     Nose: Nose normal.     Mouth/Throat:     Mouth: Mucous membranes are moist.     Pharynx: Oropharynx is clear.  Eyes:     General: No scleral icterus.       Right eye: No discharge.        Left eye: No discharge.     Extraocular Movements: Extraocular movements intact.     Conjunctiva/sclera: Conjunctivae normal.     Pupils: Pupils are equal, round, and reactive to light.  Neck:     Vascular: No JVD.  Cardiovascular:     Rate and Rhythm: Normal rate and regular rhythm.     Pulses: Normal pulses.          Radial pulses are 2+ on the right side and 2+ on the left side.     Heart sounds: Normal heart sounds.  Pulmonary:     Comments: Lungs clear to auscultation in all fields. Symmetric chest rise. No wheezing, rales, or rhonchi. 100% oxygen saturation on room air during exam. Abdominal:     Comments: Abdomen is soft, non-distended, and non-tender in all quadrants. No rigidity, no guarding. No peritoneal signs.  Musculoskeletal:        General: Normal range of motion.     Cervical back: Normal range of motion.  Skin:    General: Skin is warm and dry.     Capillary Refill: Capillary refill takes less than 2 seconds.     Findings: No rash.  Neurological:     Mental Status: She is oriented to person, place, and time.     GCS: GCS eye subscore is 4. GCS verbal subscore is 5. GCS  motor subscore is 6.     Comments: Speech is clear and goal oriented, follows commands CN III-XII intact, no facial droop Normal strength in upper and lower extremities bilaterally including dorsiflexion and plantar flexion, strong and equal grip strength Sensation normal to light and sharp touch Moves extremities without ataxia, coordination intact Normal finger to nose and rapid alternating movements Normal gait and balance   Psychiatric:        Behavior: Behavior normal.     ED Results / Procedures / Treatments   Labs (all labs ordered are listed, but only abnormal results are displayed) Labs Reviewed  BASIC METABOLIC PANEL - Abnormal; Notable for the following components:      Result Value   Glucose, Bld 105 (*)    All other components within normal limits  SARS CORONAVIRUS 2 (TAT 6-24 HRS)  CBC  I-STAT BETA HCG BLOOD, ED ([facility], [facility], [facility] ONLY)    EKG EKG Interpretation  Date/Time:  Wednesday November 08 2020 08:59:13 EST Ventricular Rate:  91 PR Interval:  142 QRS Duration: 68 QT Interval:  366 QTC Calculation: 450 R Axis:   65 Text Interpretation: Normal sinus rhythm Normal ECG No significant change since last tracing Confirmed by Gwyneth Sprout ([id number]) on 11/08/2020 12:50:04 PM   Radiology No results found.  Procedures Procedures   Medications Ordered in ED Medications - No data to display  ED Course  I have reviewed the triage vital signs and the nursing notes.  Pertinent labs & imaging results that were available during my care of the patient were reviewed by me and considered in my medical decision making (see chart for details).    MDM Rules/Calculators/A&P                          History provided by patient with additional history obtained from chart review.    Patient here for covid test. Presented to ED with shortness of breath and lightheadedness although is asymptomatic now. She is well-appearing and in no acute distress.  Neuro exam is normal,  no focal weakness.  Lungs are clear to auscultation all fields intra-abdominal work of breathing.  No hypoxia accumulation. Labs were collected in triage.  Reviewed results which show unremarkable CBC and BMP.  Pregnancy test is negative.  Covid swab is in process.  EKG shows sinus rhythm.  As patient has reassuring exam and work-up.  She was discharged home with symptomatic care.  Recommended increase fluid intake and eat breakfast in the morning to see if that helps her symptoms.  Discussed home care if Covid test is positive.  She needs to quarantine for CDC guidelines if positive. The patient appears reasonably screened and/or stabilized for discharge and I doubt any other medical condition or other [facility] requiring further screening, evaluation, or treatment in the ED at this time prior to discharge. The patient is safe for discharge with strict return precautions discussed. Recommend pcp follow up if symptoms persist.  Patricia Villanueva was evaluated in Emergency Department on 11/08/2020 for the symptoms described in the history of present illness. She was evaluated in the context of the global COVID-19 pandemic, which necessitated consideration that the patient might be at risk for infection with the SARS-CoV-2 virus that causes COVID-19. Institutional protocols and algorithms that pertain to the evaluation of patients at risk for COVID-19 are in a state of rapid change based on information released by regulatory bodies including the CDC and federal and state  organizations. These policies and algorithms were followed during the patient's care in the ED   Portions of this note were generated with Dragon dictation software. Dictation errors may occur despite best attempts at proofreading.  Final Clinical Impression(s) / ED Diagnoses Final diagnoses:  COVID-19 virus test result unknown    Rx / DC Orders ED Discharge Orders    None       Kandice Hams 11/08/20 1302    Gwyneth Sprout, MD 11/13/20 1718

## 2020-11-08 NOTE — Discharge Instructions (Signed)
Thank you for allowing [place] to care for you today.   Your blood work was normal.  Your pregnancy test was negative.  EKG of your heart was also normal.  You should try to increase your fluid intake throughout the day as we discussed.  Please return to the emergency department if you have any new or worsening symptoms.   You have a pending covid test. If positive you will receive a phone call. Results will be available on your MyChart. You should quarantine until you have test result.  Medications- You can take medications to help treat your symptoms: -Tylenol for fever and body aches. Please take as prescribed on the bottle. -Over the coutner cough medicine such as mucinex, robitussin, or other brands. -Flonase or saline nasal spray for nasal congestion -Vitamins as recommended by CDC: vitamin C, D and Zinc  Treatment- This is a virus and unfortunately there are no antibitotics approved to treat this virus at this time. It is important to monitor your symptoms closely: -You should have a theremometer at home to check your temperature when feeling feverish. -Use a pulse ox meter to measure your oxygen when feeling short of breath.  -If your fever is over 100.4 despite taking tylenol or if your oxygen level drops below 92% these are reasons to return to the emergency department for further evaluation.   -CDC quarantine guidelines are asking you to isolate for 5 days then -You can return to work, school or normal activities if on day 6 you are fever free without the use of Tylenol or ibuprofen. You need to wear a mask for the next 5 days. -If you are still having fever or sever symptoms on day 5 you need to continue isolation until day 10.   -If family members are wanting to get tested there are multiple sites in the commnuity to get an outpatient test. Go online and search for "covid testing near me." If family members are having symptoms they should follow the same quarantine  guidelines.  Again: symptoms of shortness of breath, chest pain, difficulty breathing, new onset of confusion, any symptoms that are concerning. If any of these symptoms you should come to emergency department for evaluation.   I hope you feel better soon

## 2020-11-28 ENCOUNTER — Ambulatory Visit: Payer: Medicaid Other

## 2020-12-07 ENCOUNTER — Ambulatory Visit (INDEPENDENT_AMBULATORY_CARE_PROVIDER_SITE_OTHER): Payer: Medicaid Other | Admitting: *Deleted

## 2020-12-07 ENCOUNTER — Encounter: Payer: Self-pay | Admitting: *Deleted

## 2020-12-07 ENCOUNTER — Other Ambulatory Visit: Payer: Self-pay

## 2020-12-07 VITALS — BP 150/95 | HR 79 | Ht 68.0 in | Wt 212.2 lb

## 2020-12-07 DIAGNOSIS — Z3042 Encounter for surveillance of injectable contraceptive: Secondary | ICD-10-CM

## 2020-12-07 MED ORDER — MEDROXYPROGESTERONE ACETATE 150 MG/ML IM SUSP
150.0000 mg | Freq: Once | INTRAMUSCULAR | Status: AC
  Administered 2020-12-07: 150 mg via INTRAMUSCULAR

## 2020-12-07 NOTE — Progress Notes (Signed)
Pt states she has had bleeding every Patricia Villanueva since starting Depo injections last October. Some days it is light and other days is heavy. On heavy days, she changes her pad q2hours. Pt was agreeable to receive scheduled Depo Provera injection today so that she will be covered for birth control. She was advised to schedule provider appt for evaluation of abnormal bleeding and agreed. Depo Provera 150 mg IM administered as scheduled. Pt tolerated well. Next injection is due 5/26-6/9. BP today is elevated - 150/95. Pt was prescribed Amlodipine @ PP visit on 06/27/20. She reports that she has not taken amlodipine for at least a month. At visit for Depo Provera injection on 12/14, pt was advised to follow up with PCP due to having elevated BP. Pt reports that she did not do this because she does not have PCP. I discussed options for PCP care for pt as she has [employer]. She stated that she will call [facility] to schedule appt. I instructed pt to resume taking Amlodipine 5 mg daily as previously prescribed. She voiced understanding and stated that she has medication available.

## 2020-12-26 ENCOUNTER — Ambulatory Visit: Payer: Medicaid Other | Admitting: Advanced Practice Midwife

## 2021-01-27 NOTE — Progress Notes (Signed)
Chart reviewed for nurse visit. Agree with plan of care.   Marylene Land, CNM 01/27/2021 9:45 PM

## 2021-02-22 ENCOUNTER — Ambulatory Visit: Payer: Medicaid Other

## 2021-03-12 ENCOUNTER — Other Ambulatory Visit: Payer: Self-pay

## 2021-03-12 ENCOUNTER — Emergency Department (HOSPITAL_COMMUNITY)
Admission: EM | Admit: 2021-03-12 | Discharge: 2021-03-12 | Discharge status: Against Medical Advice | Payer: Medicaid Other | Attending: Emergency Medicine | Admitting: Emergency Medicine

## 2021-03-12 ENCOUNTER — Emergency Department (HOSPITAL_COMMUNITY): Payer: Medicaid Other

## 2021-03-12 DIAGNOSIS — R519 Headache, unspecified: Secondary | ICD-10-CM | POA: Insufficient documentation

## 2021-03-12 DIAGNOSIS — R42 Dizziness and giddiness: Secondary | ICD-10-CM | POA: Insufficient documentation

## 2021-03-12 DIAGNOSIS — W19XXXA Unspecified fall, initial encounter: Secondary | ICD-10-CM | POA: Diagnosis not present

## 2021-03-12 DIAGNOSIS — M25571 Pain in right ankle and joints of right foot: Secondary | ICD-10-CM | POA: Diagnosis not present

## 2021-03-12 DIAGNOSIS — Z5321 Procedure and treatment not carried out due to patient leaving prior to being seen by health care provider: Secondary | ICD-10-CM | POA: Insufficient documentation

## 2021-03-12 LAB — COMPREHENSIVE METABOLIC PANEL
ALT: 19 U/L (ref 0–44)
AST: 29 U/L (ref 15–41)
Albumin: 3.9 g/dL (ref 3.5–5.0)
Alkaline Phosphatase: 53 U/L (ref 38–126)
Anion gap: 8 (ref 5–15)
BUN: 12 mg/dL (ref 6–20)
CO2: 22 mmol/L (ref 22–32)
Calcium: 9.2 mg/dL (ref 8.9–10.3)
Chloride: 109 mmol/L (ref 98–111)
Creatinine, Ser: 0.97 mg/dL (ref 0.44–1.00)
GFR, Estimated: >60 mL/min (ref >60)
Glucose, Bld: 105 mg/dL — ABNORMAL HIGH (ref 70–99)
Potassium: 3.3 mmol/L — ABNORMAL LOW (ref 3.5–5.1)
Sodium: 139 mmol/L (ref 135–145)
Total Bilirubin: 0.6 mg/dL (ref 0.3–1.2)
Total Protein: 7 g/dL (ref 6.5–8.1)

## 2021-03-12 LAB — CBC WITH DIFFERENTIAL/PLATELET
Abs Immature Granulocytes: 0.05 10*3/uL (ref 0.00–0.07)
Basophils Absolute: 0.1 10*3/uL (ref 0.0–0.1)
Basophils Relative: 1 %
Eosinophils Absolute: 0.1 10*3/uL (ref 0.0–0.5)
Eosinophils Relative: 1 %
HCT: 40.1 % (ref 36.0–46.0)
Hemoglobin: 12.5 g/dL (ref 12.0–15.0)
Immature Granulocytes: 1 %
Lymphocytes Relative: 23 %
Lymphs Abs: 2.5 10*3/uL (ref 0.7–4.0)
MCH: 27.2 pg (ref 26.0–34.0)
MCHC: 31.2 g/dL (ref 30.0–36.0)
MCV: 87.4 fL (ref 80.0–100.0)
Monocytes Absolute: 0.6 10*3/uL (ref 0.1–1.0)
Monocytes Relative: 6 %
Neutro Abs: 7.5 10*3/uL (ref 1.7–7.7)
Neutrophils Relative %: 68 %
Platelets: 202 10*3/uL (ref 150–400)
RBC: 4.59 MIL/uL (ref 3.87–5.11)
RDW: 12.6 % (ref 11.5–15.5)
WBC: 10.8 10*3/uL — ABNORMAL HIGH (ref 4.0–10.5)
nRBC: 0 % (ref 0.0–0.2)

## 2021-03-12 LAB — I-STAT BETA HCG BLOOD, ED (MC, WL, AP ONLY): I-stat hCG, quantitative: <5 m[IU]/mL (ref <5)

## 2021-03-12 MED ORDER — ACETAMINOPHEN 325 MG PO TABS
650.0000 mg | ORAL_TABLET | Freq: Once | ORAL | Status: AC
  Administered 2021-03-12: 14:00:00 650 mg via ORAL
  Filled 2021-03-12: qty 2

## 2021-03-12 NOTE — ED Notes (Addendum)
Pt called for vitals, no response. Moving OTF.

## 2021-03-12 NOTE — ED Triage Notes (Addendum)
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 Pt lowered herself to the ground-no fall or injury. Denies n/v, vision changes, or LOC. Endorses headache. Also reports R ankle pain from another incident this morning where she banged her ankle on piece of equipment.

## 2021-03-12 NOTE — ED Provider Notes (Signed)
Emergency Medicine Provider Triage Evaluation Note  Patricia Villanueva , a 30 y.o. female  was evaluated in triage.  Pt complains of lightheadedness, frontal HA and right ankle pain. Patient injured right ankle pain at work this morning, hit her ankle with a piece of equipment. HA started at work, no hx of migraines.   Review of Systems  Positive: HA, lightheadedness, ankle pain Negative: Numbness, tingling, weakness, blurry or double vision  Physical Exam  BP 127/69 (BP Location: Right Arm)   Pulse 85   Temp 98.1 F (36.7 C) (Oral)   Resp 15   SpO2 100%  Gen:   Awake, no distress   Resp:  Normal effort  MSK:   Moves extremities without difficulty  Other:  TTP over the lateral and medial malleolus of the right ankle, no swelling  Medical Decision Making  Medically screening exam initiated at 1:55 PM.  Appropriate orders placed.  TAWANDA SCHALL was informed that the remainder of the evaluation will be completed by another provider, this initial triage assessment does not replace that evaluation, and the importance of remaining in the ED until their evaluation is complete.  This chart was dictated using voice recognition software, Dragon. Despite the best efforts of this provider to proofread and correct errors, errors may still occur which can change documentation meaning.    Sherrilee Gilles 03/12/21 1359    Mancel Bale, MD 03/12/21 1815

## 2021-03-14 ENCOUNTER — Emergency Department (HOSPITAL_COMMUNITY)
Admission: EM | Admit: 2021-03-14 | Discharge: 2021-03-14 | Disposition: A | Discharge status: Home / Self Care | Payer: Medicaid Other | Attending: Emergency Medicine | Admitting: Emergency Medicine

## 2021-03-14 ENCOUNTER — Other Ambulatory Visit: Payer: Self-pay

## 2021-03-14 ENCOUNTER — Encounter (HOSPITAL_COMMUNITY): Payer: Self-pay | Admitting: Pharmacy Technician

## 2021-03-14 DIAGNOSIS — H538 Other visual disturbances: Secondary | ICD-10-CM | POA: Diagnosis not present

## 2021-03-14 DIAGNOSIS — Z79899 Other long term (current) drug therapy: Secondary | ICD-10-CM | POA: Insufficient documentation

## 2021-03-14 DIAGNOSIS — Z8616 Personal history of COVID-19: Secondary | ICD-10-CM | POA: Insufficient documentation

## 2021-03-14 DIAGNOSIS — R42 Dizziness and giddiness: Secondary | ICD-10-CM | POA: Diagnosis not present

## 2021-03-14 DIAGNOSIS — M25571 Pain in right ankle and joints of right foot: Secondary | ICD-10-CM | POA: Insufficient documentation

## 2021-03-14 DIAGNOSIS — R519 Headache, unspecified: Secondary | ICD-10-CM | POA: Diagnosis not present

## 2021-03-14 DIAGNOSIS — E876 Hypokalemia: Secondary | ICD-10-CM | POA: Insufficient documentation

## 2021-03-14 DIAGNOSIS — R55 Syncope and collapse: Secondary | ICD-10-CM | POA: Insufficient documentation

## 2021-03-14 LAB — CBC
HCT: 38.5 % (ref 36.0–46.0)
Hemoglobin: 12.2 g/dL (ref 12.0–15.0)
MCH: 27.4 pg (ref 26.0–34.0)
MCHC: 31.7 g/dL (ref 30.0–36.0)
MCV: 86.5 fL (ref 80.0–100.0)
Platelets: 187 10*3/uL (ref 150–400)
RBC: 4.45 MIL/uL (ref 3.87–5.11)
RDW: 12.4 % (ref 11.5–15.5)
WBC: 4.8 10*3/uL (ref 4.0–10.5)
nRBC: 0 % (ref 0.0–0.2)

## 2021-03-14 LAB — BASIC METABOLIC PANEL
Anion gap: 6 (ref 5–15)
BUN: 8 mg/dL (ref 6–20)
CO2: 25 mmol/L (ref 22–32)
Calcium: 8.6 mg/dL — ABNORMAL LOW (ref 8.9–10.3)
Chloride: 108 mmol/L (ref 98–111)
Creatinine, Ser: 1.04 mg/dL — ABNORMAL HIGH (ref 0.44–1.00)
GFR, Estimated: >60 mL/min (ref >60)
Glucose, Bld: 112 mg/dL — ABNORMAL HIGH (ref 70–99)
Potassium: 3.3 mmol/L — ABNORMAL LOW (ref 3.5–5.1)
Sodium: 139 mmol/L (ref 135–145)

## 2021-03-14 LAB — I-STAT BETA HCG BLOOD, ED (MC, WL, AP ONLY): I-stat hCG, quantitative: <5 m[IU]/mL (ref <5)

## 2021-03-14 MED ORDER — KETOROLAC TROMETHAMINE 30 MG/ML IJ SOLN
30.0000 mg | Freq: Once | INTRAMUSCULAR | Status: AC
  Administered 2021-03-14: 12:00:00 30 mg via INTRAVENOUS
  Filled 2021-03-14: qty 1

## 2021-03-14 MED ORDER — POTASSIUM CHLORIDE CRYS ER 20 MEQ PO TBCR
20.0000 meq | EXTENDED_RELEASE_TABLET | Freq: Once | ORAL | Status: AC
  Administered 2021-03-14: 14:00:00 20 meq via ORAL
  Filled 2021-03-14: qty 1

## 2021-03-14 MED ORDER — DIPHENHYDRAMINE HCL 50 MG/ML IJ SOLN
12.5000 mg | Freq: Once | INTRAMUSCULAR | Status: AC
  Administered 2021-03-14: 12:00:00 12.5 mg via INTRAVENOUS
  Filled 2021-03-14: qty 1

## 2021-03-14 MED ORDER — SODIUM CHLORIDE 0.9 % IV BOLUS
1000.0000 mL | Freq: Once | INTRAVENOUS | Status: AC
  Administered 2021-03-14: 12:00:00 1000 mL via INTRAVENOUS

## 2021-03-14 MED ORDER — METOCLOPRAMIDE HCL 5 MG/ML IJ SOLN
10.0000 mg | Freq: Once | INTRAMUSCULAR | Status: AC
  Administered 2021-03-14: 12:00:00 10 mg via INTRAVENOUS
  Filled 2021-03-14: qty 2

## 2021-03-14 NOTE — ED Notes (Signed)
Patient verbalizes understanding of discharge instructions. Opportunity for questioning and answers were provided. Armband removed by staff, pt discharged from ED and ambulated to lobby to return home.   

## 2021-03-14 NOTE — Discharge Instructions (Addendum)
You were seen in the emergency department for feeling lightheaded, headache for 3 days.  Your lab work showed your potassium to be mildly low and you were given some potassium.  You were given IV fluids and a migraine cocktail with improvement in your symptoms.  Please drink plenty of fluids and follow-up with your doctor.  Return to the emergency department if any worsening or concerning symptoms.

## 2021-03-14 NOTE — ED Triage Notes (Signed)
Pt reports ongoing lightheadedness and frontal headache onset Monday. Pt came Monday but left before seeing a provider. Pt with no unilateral weakness, facial droop. Pt reports only feeling lightheaded when standing.

## 2021-03-14 NOTE — ED Provider Notes (Signed)
[facility] EMERGENCY DEPARTMENT Provider Note   CSN: [id number] Arrival date & time: 03/14/21  1443     History Chief Complaint  Patient presents with   Dizziness    Patricia Villanueva is a 30 y.o. female.  She said she passed out at work 2 days ago.  It was a hot environment and she felt lightheaded.  Since then she has had a headache frontal throbbing 8 out of 10 along with feeling lightheaded.  Sometimes has some blurry vision.  No numbness or weakness.  No nausea vomiting diarrhea constipation or urinary symptoms.  No vaginal bleeding or discharge.  At that time she also had right ankle pain from hitting it on some machine.  The ankle is feeling better.  She was seen 2 days ago and had lab work done but left without being seen.  Ankle x-ray was also negative.  Continues to have headache.  Lightheadedness.  Has tried nothing for it.  She said they stopped her blood pressure medicine a few months ago  The history is provided by the patient.  Dizziness Quality:  Lightheadedness Severity:  Moderate Onset quality:  Unable to specify Duration:  3 days Timing:  Intermittent Progression:  Unchanged Chronicity:  New Context: physical activity and standing up   Relieved by:  None tried Worsened by:  Standing up Ineffective treatments:  None tried Associated symptoms: headaches and syncope   Associated symptoms: no chest pain, no nausea, no shortness of breath, no vision changes and no vomiting   Headaches:    Severity:  Severe   Onset quality:  Gradual   Duration:  3 days   Timing:  Constant   Progression:  Unchanged   Chronicity:  New     Past Medical History:  Diagnosis Date   Chlamydia    Medical history non-contributory     Patient Active Problem List   Diagnosis Date Noted   Alpha thalassemia silent carrier 11/11/2019   History of COVID-19 10/08/2019   History of irregular menstrual cycles 02/13/2013    Past Surgical History:  Procedure Laterality  Date   arm surgery     left arm surgery to repair a nerve   arm surgery     DENTAL SURGERY     NERVE EXPLORATION  04/04/2012   Procedure: NERVE EXPLORATION;  Surgeon: Sharma Covert, MD;  Location: [facility] OR;  Service: Orthopedics;  Laterality: Left;  Left Arm Laceration with Nerve Repair     OB History     Gravida  3   Para  3   Term  3   Preterm  0   AB  0   Living  3      SAB  0   IAB  0   Ectopic  0   Multiple  0   Live Births  3           Family History  Problem Relation Age of Onset   Hypertension Mother    Alcohol abuse Neg Hx    Arthritis Neg Hx    Birth defects Neg Hx    Asthma Neg Hx    Cancer Neg Hx    COPD Neg Hx    Diabetes Neg Hx    Depression Neg Hx    Drug abuse Neg Hx    Early death Neg Hx    Hearing loss Neg Hx    Heart disease Neg Hx    Hyperlipidemia Neg Hx    Kidney  disease Neg Hx    Learning disabilities Neg Hx    Mental illness Neg Hx    Mental retardation Neg Hx    Miscarriages / Stillbirths Neg Hx    Stroke Neg Hx    Vision loss Neg Hx     Social History   Tobacco Use   Smoking status: Never   Smokeless tobacco: Never  Vaping Use   Vaping Use: Never used  Substance Use Topics   Alcohol use: Yes    Comment: occasionally   Drug use: Not Currently    Types: Marijuana    Home Medications Prior to Admission medications   Medication Sig Start Date End Date Taking? Authorizing Provider  amLODipine (NORVASC) 5 MG tablet Take 1 tablet (5 mg total) by mouth daily. Patient not taking: No sig reported 06/27/20   Bernerd Limbo, CNM  Blood Pressure Monitoring DEVI 1 Device by Does not apply route once a week. 11/01/19   Burleson, Brand Males, NP  ibuprofen (ADVIL) 600 MG tablet Take 1 tablet (600 mg total) by mouth every 6 (six) hours. Patient not taking: No sig reported 05/12/20   [place], [place], MD  Prenatal Vit-Fe Phos-FA-Omega (VITAFOL GUMMIES) 3.33-0.333-34.8 MG CHEW Chew 3 each by mouth daily. 11/01/19   Currie Paris,  NP    Allergies    Patient has no known allergies.  Review of Systems   Review of Systems  Constitutional:  Negative for fever.  HENT:  Negative for sore throat.   Eyes:  Negative for pain.  Respiratory:  Negative for shortness of breath.   Cardiovascular:  Positive for syncope. Negative for chest pain.  Gastrointestinal:  Negative for abdominal pain, nausea and vomiting.  Genitourinary:  Negative for dysuria.  Musculoskeletal:  Negative for neck pain.  Skin:  Negative for rash.  Neurological:  Positive for dizziness, light-headedness and headaches.   Physical Exam Updated Vital Signs BP 130/90   Pulse 91   Temp 98.7 F (37.1 C) (Oral)   Resp 20   SpO2 100%   Physical Exam Vitals and nursing note reviewed.  Constitutional:      General: She is not in acute distress.    Appearance: Normal appearance. She is well-developed.  HENT:     Head: Normocephalic and atraumatic.  Eyes:     Conjunctiva/sclera: Conjunctivae normal.  Cardiovascular:     Rate and Rhythm: Normal rate and regular rhythm.     Heart sounds: No murmur heard. Pulmonary:     Effort: Pulmonary effort is normal. No respiratory distress.     Breath sounds: Normal breath sounds.  Abdominal:     Palpations: Abdomen is soft.     Tenderness: There is no abdominal tenderness.  Musculoskeletal:        General: No deformity or signs of injury. Normal range of motion.     Cervical back: Normal range of motion and neck supple.  Skin:    General: Skin is warm and dry.  Neurological:     General: No focal deficit present.     Mental Status: She is alert and oriented to person, place, and time.     Cranial Nerves: No cranial nerve deficit.     Sensory: No sensory deficit.     Motor: No weakness.    ED Results / Procedures / Treatments   Labs (all labs ordered are listed, but only abnormal results are displayed) Labs Reviewed  BASIC METABOLIC PANEL - Abnormal; Notable for the following components:  Result Value   Potassium 3.3 (*)    Glucose, Bld 112 (*)    Creatinine, Ser 1.04 (*)    Calcium 8.6 (*)    All other components within normal limits  CBC  I-STAT BETA HCG BLOOD, ED ([facility], [facility], [facility] ONLY)    EKG EKG Interpretation  Date/Time:  Wednesday March 14 2021 08:36:21 EDT Ventricular Rate:  96 PR Interval:  142 QRS Duration: 80 QT Interval:  378 QTC Calculation: 477 R Axis:   65 Text Interpretation: Normal sinus rhythm Normal ECG No significant change since 2 days ago Confirmed by Meridee Score [phone]) on 03/14/2021 11:48:34 AM  Radiology DG Ankle Complete Right  Result Date: 03/12/2021 CLINICAL DATA:  Right ankle pain. EXAM: RIGHT ANKLE - COMPLETE 3+ VIEW COMPARISON:  None. FINDINGS: There is no evidence of fracture, dislocation, or joint effusion. There is no evidence of arthropathy or other focal bone abnormality. Soft tissues are unremarkable. IMPRESSION: Negative. Electronically Signed   By: Obie Dredge M.D.   On: 03/12/2021 14:27    Procedures Procedures   Medications Ordered in ED Medications  sodium chloride 0.9 % bolus 1,000 mL (has no administration in time range)  ketorolac (TORADOL) 30 MG/ML injection 30 mg (has no administration in time range)  metoCLOPramide (REGLAN) injection 10 mg (has no administration in time range)  diphenhydrAMINE (BENADRYL) injection 12.5 mg (has no administration in time range)    ED Course  I have reviewed the triage vital signs and the nursing notes.  Pertinent labs & imaging results that were available during my care of the patient were reviewed by me and considered in my medical decision making (see chart for details).  Clinical Course as of 03/14/21 2032  Wed Mar 14, 2021  1319 Patient feels better after migraine cocktail.  Lab work fairly unremarkable. [MB]    Clinical Course User Index [MB] Terrilee Files, MD   MDM Rules/Calculators/A&P                         This patient complains of lightheadedness headache  syncope; this involves an extensive number of treatment Options and is a complaint that carries with it a high risk of complications and Morbidity. The differential includes vasovagal, hypovolemia, migraine, tension headache, dehydration  I ordered, reviewed and interpreted labs, which included CBC with normal white count normal hemoglobin, chemistries with mildly low potassium, pregnancy test negative I ordered medication IV fluids Reglan and Toradol with improvement in her headache Previous records obtained and reviewed ED labs and x-ray done 2 days ago  After the interventions stated above, I reevaluated the patient and found patient symptoms to be improved.  She feels better and is hoping to be discharged.  She is asking for a note for work.   Final Clinical Impression(s) / ED Diagnoses Final diagnoses:  Lightheadedness  Frontal headache  Hypokalemia    Rx / DC Orders ED Discharge Orders     None        Terrilee Files, MD 03/14/21 2034

## 2021-03-15 ENCOUNTER — Telehealth: Payer: Self-pay

## 2021-03-15 NOTE — Telephone Encounter (Signed)
Transition Care Management Unsuccessful Follow-up Telephone Call  Date of discharge and from where:  03/14/2021 from [facility]   Attempts:  1st Attempt  Reason for unsuccessful TCM follow-up call:  Left voice message    

## 2021-03-16 NOTE — Telephone Encounter (Signed)
Transition Care Management Unsuccessful Follow-up Telephone Call  Date of discharge and from where:  03/14/2021 - [facility]  Attempts:  2nd Attempt  Reason for unsuccessful TCM follow-up call:  Left voice message

## 2021-03-19 ENCOUNTER — Ambulatory Visit: Payer: Medicaid Other

## 2021-03-19 NOTE — Telephone Encounter (Signed)
Transition Care Management Unsuccessful Follow-up Telephone Call  Date of discharge and from where:  03/14/2021 from [facility]  Attempts:  3rd Attempt  Reason for unsuccessful TCM follow-up call:  Unable to reach patient

## 2021-03-22 ENCOUNTER — Ambulatory Visit (INDEPENDENT_AMBULATORY_CARE_PROVIDER_SITE_OTHER): Payer: Medicaid Other

## 2021-03-22 ENCOUNTER — Other Ambulatory Visit: Payer: Self-pay

## 2021-03-22 VITALS — BP 119/85 | HR 91 | Wt 214.2 lb

## 2021-03-22 DIAGNOSIS — Z3042 Encounter for surveillance of injectable contraceptive: Secondary | ICD-10-CM

## 2021-03-22 DIAGNOSIS — Z0000 Encounter for general adult medical examination without abnormal findings: Secondary | ICD-10-CM

## 2021-03-22 MED ORDER — MEDROXYPROGESTERONE ACETATE 150 MG/ML IM SUSP
150.0000 mg | Freq: Once | INTRAMUSCULAR | Status: AC
Start: 2021-03-22 — End: 2021-03-22
  Administered 2021-03-22: 150 mg via INTRAMUSCULAR

## 2021-03-22 NOTE — Progress Notes (Signed)
Patricia Villanueva here for Depo-Provera Injection. Pt is here 15 weeks following last injection; no UPT or back up contraception needed per protocol. Injection administered without complication. Patient will return in 3 months for next injection between 06/07/21 and 06/21/21. Next annual visit due September 2022. Recommended pt return for annual visit and Depo at the same time. Pt also has continued irregular bleeding on Depo Provera; recommended pt return for Depo at the beginning of injection window.  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 States she has not been able to find a provider that accepts her insurance. Pt agreeable to referral to Family Medicine at [facility].   Marjo Bicker, RN 03/22/2021  5:34 PM

## 2021-03-26 NOTE — Progress Notes (Signed)
Attestation of Attending Supervision of clinical support staff: I agree with the care provided to this patient and was available for any consultation.  I have reviewed the RN's note and chart. I was available for consult and to see the patient if needed.   Kenya Shiraishi Niles Makeila Yamaguchi, MD, MPH, ABFM Attending Physician Faculty Practice- [facility]  

## 2021-07-16 ENCOUNTER — Other Ambulatory Visit: Payer: Self-pay

## 2021-07-16 ENCOUNTER — Encounter: Payer: Self-pay | Admitting: Family Medicine

## 2021-07-16 ENCOUNTER — Ambulatory Visit (INDEPENDENT_AMBULATORY_CARE_PROVIDER_SITE_OTHER): Payer: Medicaid Other

## 2021-07-16 VITALS — BP 126/84 | HR 93 | Ht 68.0 in | Wt 215.9 lb

## 2021-07-16 DIAGNOSIS — Z3042 Encounter for surveillance of injectable contraceptive: Secondary | ICD-10-CM

## 2021-07-16 MED ORDER — MEDROXYPROGESTERONE ACETATE 150 MG/ML IM SUSP
150.0000 mg | Freq: Once | INTRAMUSCULAR | Status: AC
  Administered 2021-07-16: 150 mg via INTRAMUSCULAR

## 2021-07-16 NOTE — Progress Notes (Signed)
Patricia Villanueva here for Depo-Provera Injection. Injection administered without complication. Patient will return in 3 months for next injection between Jan 2 and Jan 16,2023. Next annual visit due now. Pt to make [facility]EX appt today.  [name], RN 07/16/2021  4:00 PM  Pt states missed last  Depo window between 9/8-9/22. Per protocol ran UPT: Neg.  last IC x 1 week ago that was protected.   Judeth Cornfield, RN

## 2021-09-13 ENCOUNTER — Ambulatory Visit: Payer: Medicaid Other | Admitting: Student

## 2021-09-28 ENCOUNTER — Ambulatory Visit: Payer: Medicaid Other | Admitting: Nurse Practitioner

## 2021-10-02 ENCOUNTER — Ambulatory Visit: Payer: Medicaid Other

## 2021-10-06 NOTE — Progress Notes (Signed)
Chart reviewed for nurse visit. Agree with plan of care.  ° °Kloey Cazarez Lorraine, CNM °10/06/2021 3:25 AM  ° °

## 2021-10-17 ENCOUNTER — Ambulatory Visit (INDEPENDENT_AMBULATORY_CARE_PROVIDER_SITE_OTHER): Payer: Medicaid Other

## 2021-10-17 ENCOUNTER — Other Ambulatory Visit: Payer: Self-pay

## 2021-10-17 VITALS — BP 122/85 | HR 96 | Ht 68.0 in | Wt 222.7 lb

## 2021-10-17 DIAGNOSIS — Z3042 Encounter for surveillance of injectable contraceptive: Secondary | ICD-10-CM

## 2021-10-17 MED ORDER — MEDROXYPROGESTERONE ACETATE 150 MG/ML IM SUSP
150.0000 mg | Freq: Once | INTRAMUSCULAR | Status: AC
  Administered 2021-10-17: 150 mg via INTRAMUSCULAR

## 2021-10-17 NOTE — Progress Notes (Signed)
Patricia Villanueva here for Depo-Provera  Injection.  Injection administered without complication. Patient will return in 3 months for next injection between 01/02/22- 01/16/22.  ERETRIA MANTERNACH, CMA 10/17/2021  4:02 PM

## 2021-10-17 NOTE — Progress Notes (Signed)
Patient was assessed and managed by nursing staff during this encounter. I have reviewed the chart and agree with the documentation and plan. I have also made any necessary editorial changes.  Warden Fillers, MD 10/17/2021 5:57 PM

## 2021-11-26 ENCOUNTER — Other Ambulatory Visit: Payer: Self-pay

## 2021-11-26 ENCOUNTER — Encounter (HOSPITAL_COMMUNITY): Payer: Self-pay

## 2021-11-26 ENCOUNTER — Ambulatory Visit (HOSPITAL_COMMUNITY)
Admission: EM | Admit: 2021-11-26 | Discharge: 2021-11-26 | Disposition: A | Discharge status: Home / Self Care | Payer: Medicaid Other | Attending: Internal Medicine | Admitting: Internal Medicine

## 2021-11-26 DIAGNOSIS — K047 Periapical abscess without sinus: Secondary | ICD-10-CM

## 2021-11-26 MED ORDER — AMOXICILLIN-POT CLAVULANATE 875-125 MG PO TABS: 1.0000 | ORAL_TABLET | Freq: Two times a day (BID) | ORAL | 0 refills | Status: DC

## 2021-11-26 NOTE — Discharge Instructions (Addendum)
Please take antibiotics as prescribed Warm salt water gargle Orajel as needed You need to see a dentist for dental extraction Take anti-inflammatory agents as needed for pain Return to urgent care if symptoms worsen.

## 2021-11-26 NOTE — ED Provider Notes (Signed)
[facility]    CSN: [id number] Arrival date & time: 11/26/21  1050      History   Chief Complaint Chief Complaint  Patient presents with   Facial Swelling   Dental Pain    HPI Patricia Villanueva is a 31 y.o. female comes to urgent care with a 2-day history of left-sided facial swelling and left jaw pain.  Patient's symptoms has worsened overnight.  Pain is throbbing in nature.  No fever or chills.  No trauma to the left side of the face.  Second left mandibular molar is not fully erupted.  Gum swelling present.  No discharge.  Patient has taken over-the-counter ibuprofen with no improvement in the pain.  Hence the visit to the urgent care for further evaluation. HPI  Past Medical History:  Diagnosis Date   Chlamydia    Medical history non-contributory     Patient Active Problem List   Diagnosis Date Noted   Alpha thalassemia silent carrier 11/11/2019   History of COVID-19 10/08/2019   History of irregular menstrual cycles 02/13/2013    Past Surgical History:  Procedure Laterality Date   arm surgery     left arm surgery to repair a nerve   arm surgery     DENTAL SURGERY     NERVE EXPLORATION  04/04/2012   Procedure: NERVE EXPLORATION;  Surgeon: Linna Hoff, MD;  Location: [facility];  Service: Orthopedics;  Laterality: Left;  Left Arm Laceration with Nerve Repair    OB History     Gravida  3   Para  3   Term  3   Preterm  0   AB  0   Living  3      SAB  0   IAB  0   Ectopic  0   Multiple  0   Live Births  3            Home Medications    Prior to Admission medications   Medication Sig Start Date End Date Taking? Authorizing Provider  amoxicillin-clavulanate (AUGMENTIN) 875-125 MG tablet Take 1 tablet by mouth every 12 (twelve) hours. 11/26/21  Yes Delpha Perko, Myrene Galas, MD    Family History Family History  Problem Relation Age of Onset   Hypertension Mother    Alcohol abuse Neg Hx    Arthritis Neg Hx    Birth defects Neg Hx     Asthma Neg Hx    Cancer Neg Hx    COPD Neg Hx    Diabetes Neg Hx    Depression Neg Hx    Drug abuse Neg Hx    Early death Neg Hx    Hearing loss Neg Hx    Heart disease Neg Hx    Hyperlipidemia Neg Hx    Kidney disease Neg Hx    Learning disabilities Neg Hx    Mental illness Neg Hx    Mental retardation Neg Hx    Miscarriages / Stillbirths Neg Hx    Stroke Neg Hx    Vision loss Neg Hx     Social History Social History   Tobacco Use   Smoking status: Never   Smokeless tobacco: Never  Vaping Use   Vaping Use: Never used  Substance Use Topics   Alcohol use: Yes    Comment: occasionally   Drug use: Not Currently    Types: Marijuana     Allergies   Patient has no known allergies.   Review of Systems Review of  Systems As per HPI  Physical Exam Triage Vital Signs ED Triage Vitals  Enc Vitals Group     BP 11/26/21 1209 (!) 142/90     Pulse Rate 11/26/21 1209 74     Resp 11/26/21 1209 20     Temp 11/26/21 1209 97.9 F (36.6 C)     Temp Source 11/26/21 1209 Oral     SpO2 11/26/21 1209 100 %     Weight --      Height --      Head Circumference --      Peak Flow --      Pain Score 11/26/21 1208 9     Pain Loc --      Pain Edu? --      Excl. in [place]? --    No data found.  Updated Vital Signs BP (!) 142/90 (BP Location: Right Arm)    Pulse 74    Temp 97.9 F (36.6 C) (Oral)    Resp 20    SpO2 100%   Visual Acuity Right Eye Distance:   Left Eye Distance:   Bilateral Distance:    Right Eye Near:   Left Eye Near:    Bilateral Near:     Physical Exam Vitals and nursing note reviewed.  Constitutional:      General: She is not in acute distress.    Appearance: She is not ill-appearing.  HENT:     Right Ear: Tympanic membrane normal.     Left Ear: Tympanic membrane normal.     Mouth/Throat:     Comments: Second left mandibular tooth is partially erupted.  No dental cavity noted. Neurological:     Mental Status: She is alert.     UC Treatments /  Results  Labs (all labs ordered are listed, but only abnormal results are displayed) Labs Reviewed - No data to display  EKG   Radiology No results found.  Procedures Procedures (including critical care time)  Medications Ordered in UC Medications - No data to display  Initial Impression / Assessment and Plan / UC Course  I have reviewed the triage vital signs and the nursing notes.  Pertinent labs & imaging results that were available during my care of the patient were reviewed by me and considered in my medical decision making (see chart for details).     1.  Dental infection: Augmentin 1 tablet twice daily for 7 days Ibuprofen 600 mg every 6 hours as needed for pain Warm salt water gargle Return to urgent care if symptoms worsen You need dental evaluation and possible tooth extraction. Final Clinical Impressions(s) / UC Diagnoses   Final diagnoses:  Dental infection     Discharge Instructions      Please take antibiotics as prescribed Warm salt water gargle Orajel as needed You need to see a dentist for dental extraction Take anti-inflammatory agents as needed for pain Return to urgent care if symptoms worsen.   ED Prescriptions     Medication Sig Dispense Auth. Provider   amoxicillin-clavulanate (AUGMENTIN) 875-125 MG tablet Take 1 tablet by mouth every 12 (twelve) hours. 14 tablet Aydee Mcnew, Myrene Galas, MD      PDMP not reviewed this encounter.   Chase Picket, MD 11/26/21 1335

## 2021-11-26 NOTE — ED Triage Notes (Signed)
Last night pt noticed facial swelling to left jaw (she reports having dental issues the left side).

## 2022-01-02 ENCOUNTER — Ambulatory Visit: Payer: Medicaid Other

## 2022-01-24 ENCOUNTER — Ambulatory Visit: Payer: Medicaid Other

## 2022-02-19 ENCOUNTER — Ambulatory Visit (INDEPENDENT_AMBULATORY_CARE_PROVIDER_SITE_OTHER): Payer: Medicaid Other

## 2022-02-21 ENCOUNTER — Ambulatory Visit (INDEPENDENT_AMBULATORY_CARE_PROVIDER_SITE_OTHER): Payer: Medicaid Other

## 2022-02-21 VITALS — BP 137/85 | HR 86 | Wt 207.1 lb

## 2022-02-21 DIAGNOSIS — Z3202 Encounter for pregnancy test, result negative: Secondary | ICD-10-CM | POA: Diagnosis not present

## 2022-02-21 DIAGNOSIS — Z3042 Encounter for surveillance of injectable contraceptive: Secondary | ICD-10-CM

## 2022-02-21 LAB — POCT PREGNANCY, URINE: Preg Test, Ur: NEGATIVE

## 2022-02-21 MED ORDER — MEDROXYPROGESTERONE ACETATE 150 MG/ML IM SUSP
150.0000 mg | Freq: Once | INTRAMUSCULAR | Status: AC
  Administered 2022-02-21: 150 mg via INTRAMUSCULAR

## 2022-02-21 NOTE — Progress Notes (Signed)
Patricia Villanueva here for Depo-Provera Injection. Last depo given 10/17/21. Per patient she missed her last dose of depo. Per patient she has not had unprotected sex in the last two weeks. Urine pregnancy test negative. Injection administered without complication. Explained to patient a backup method of contraception will be necessary for the next two weeks. Patient will return in 3 months for next injection between August 10 and August 24. Next annual visit due with next depo.   Cline Crock, RN 02/21/2022

## 2022-02-28 NOTE — Progress Notes (Signed)
Appointment not completed, patient to return.

## 2022-03-13 ENCOUNTER — Telehealth: Payer: Self-pay | Admitting: Family Medicine

## 2022-03-13 NOTE — Telephone Encounter (Signed)
Called patient to set [place] appt.Marland KitchenMarland KitchenAnnual w/ pap & depo Aug 10-Aug 24

## 2022-05-29 ENCOUNTER — Ambulatory Visit (INDEPENDENT_AMBULATORY_CARE_PROVIDER_SITE_OTHER): Payer: Medicaid Other

## 2022-05-29 ENCOUNTER — Encounter (HOSPITAL_COMMUNITY): Payer: Self-pay

## 2022-05-29 ENCOUNTER — Ambulatory Visit (HOSPITAL_COMMUNITY)
Admission: EM | Admit: 2022-05-29 | Discharge: 2022-05-29 | Disposition: A | Discharge status: Home / Self Care | Payer: Medicaid Other | Attending: Internal Medicine | Admitting: Internal Medicine

## 2022-05-29 DIAGNOSIS — S93402A Sprain of unspecified ligament of left ankle, initial encounter: Secondary | ICD-10-CM

## 2022-05-29 DIAGNOSIS — M25572 Pain in left ankle and joints of left foot: Secondary | ICD-10-CM

## 2022-05-29 DIAGNOSIS — M7989 Other specified soft tissue disorders: Secondary | ICD-10-CM | POA: Diagnosis not present

## 2022-05-29 NOTE — Discharge Instructions (Addendum)
You have a sprained ankle.  Your x-ray today did not show injury to the boned of your ankle. Your pain is most likely being caused by irritation to the soft tissues, this should improve as time progresses.   You may apply heat or ice, whichever makes you feel better, to affected area in 15 minute intervals  You may continue activity as tolerated, there is no injury therefore, it is important that you continue to move around so you do not loose strength to the area  You can wear a compression sleeve or ankle brace for additional support while completing activities for comfort   If symptoms persist past 2 weeks, you may follow up at urgent care or with orthopedic specialist for evaluation, an orthopedic doctor specializes in the bone, they may provide  management such as but not limited to imaging, long term medications and physical therapy

## 2022-05-29 NOTE — ED Triage Notes (Signed)
Pt c/o lt ankle/foot pain x1wk. Denies known injury but states climbs a lot of stairs at work . Took ibuprofen with little relief.

## 2022-05-29 NOTE — ED Provider Notes (Signed)
[place]   [id number] 05/29/22 Arrival Time: 7425  ASSESSMENT & PLAN:  1. Sprain of left ankle, unspecified ligament, initial encounter    -Her clinical presentation and exam is consistent with a left ankle sprain, although she does not have the classic history of a inversion injury.  Given her diffuse bony tenderness, x-rays were obtained that show no acute bony abnormalities, just the incidental finding of an os trigonum.  Reassurance was provided.  I recommended RICE treatment for ankle sprain along with a compression sleeve or a lace up ankle brace as needed for comfort.  Follow-up with sports medicine in 2 weeks if no improvement.  All questions answered she agrees to plan.  No orders of the defined types were placed in this encounter.    Discharge Instructions      You have a sprained ankle.  Your x-ray today did not show injury to the boned of your ankle. Your pain is most likely being caused by irritation to the soft tissues, this should improve as time progresses.   You may apply heat or ice, whichever makes you feel better, to affected area in 15 minute intervals  You may continue activity as tolerated, there is no injury therefore, it is important that you continue to move around so you do not loose strength to the area  You can wear a compression sleeve or ankle brace for additional support while completing activities for comfort   If symptoms persist past 2 weeks, you may follow up at urgent care or with orthopedic specialist for evaluation, an orthopedic doctor specializes in the bone, they may provide  management such as but not limited to imaging, long term medications and physical therapy         Reviewed expectations re: course of current medical issues. Questions answered. Outlined signs and symptoms indicating need for more acute intervention. Patient verbalized understanding. After Visit Summary given.   SUBJECTIVE: Pleasant 31 year old  female comes to urgent care to be evaluated for left ankle pain.  Her left ankle has been painful and swollen for about a week now.  Unfortunately became worse over the last 2 days to the point where she was unable to walk comfortably.  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  She has tried ibuprofen and elevating and icing the ankle.  She has a lace up ankle brace as well.  Denies any numbness or tingling the ankle. Denies any prior injuries to the ankle.  No LMP recorded. Patient has had an injection. Past Surgical History:  Procedure Laterality Date   arm surgery     left arm surgery to repair a nerve   arm surgery     DENTAL SURGERY     NERVE EXPLORATION  04/04/2012   Procedure: NERVE EXPLORATION;  Surgeon: Linna Hoff, MD;  Location: [facility];  Service: Orthopedics;  Laterality: Left;  Left Arm Laceration with Nerve Repair     OBJECTIVE:  Vitals:   05/29/22 0919  BP: (!) 152/97  Pulse: 60  Resp: 18  Temp: 98.4 F (36.9 C)  TempSrc: Oral  SpO2: 100%     Physical Exam Vitals reviewed.  Cardiovascular:     Rate and Rhythm: Normal rate.  Pulmonary:     Effort: Pulmonary effort is normal.  Musculoskeletal:     Comments: L ankle -generalized swelling of the left ankle.  No overlying erythema  or ecchymosis.  She is tender to palpation at the posterior border of the medial malleolus, posterior border of the lateral malleolus, base of the fifth met, and over ATFL.  Nontender over navicular.  Dorsiflexion plantarflexion are limited by pain.  She has a positive anterior drawer test and a positive talar tilt test.  She is neurovascular intact distally  Neurological:     Mental Status: She is alert.      Labs: Results for orders placed or performed in visit on 02/21/22  Pregnancy, urine POC  Result Value Ref Range   Preg Test, Ur NEGATIVE NEGATIVE   Labs Reviewed - No data to  display  Imaging: DG Ankle Complete Left  Result Date: 05/29/2022 CLINICAL DATA:  Left ankle/foot pain and swelling. Denies injury, climbs enlarged of stairs at work. EXAM: LEFT ANKLE COMPLETE - 3+ VIEW COMPARISON:  Radiograph dated March 12, 2021 FINDINGS: There is no evidence of fracture, dislocation, or joint effusion. There is no evidence of arthropathy or other focal bone abnormality. Os trigonum incidentally noted. Soft tissues are unremarkable. IMPRESSION: No evidence of fracture or dislocation.  No appreciable arthropathy. Electronically Signed   By: Keane Police D.O.   On: 05/29/2022 10:00     No Known Allergies                                             Past Medical History:  Diagnosis Date   Chlamydia    Medical history non-contributory     Social History   Socioeconomic History   Marital status: Single    Spouse name: Not on file   Number of children: Not on file   Years of education: Not on file   Highest education level: Not on file  Occupational History   Not on file  Tobacco Use   Smoking status: Never   Smokeless tobacco: Never  Vaping Use   Vaping Use: Never used  Substance and Sexual Activity   Alcohol use: Yes    Comment: occasionally   Drug use: Not Currently    Types: Marijuana   Sexual activity: Yes    Birth control/protection: Injection  Other Topics Concern   Not on file  Social History Narrative   Not on file   Social Determinants of Health   Financial Resource Strain: Not on file  Food Insecurity: No Food Insecurity (09/12/2020)   Hunger Vital Sign    Worried About Running Out of Food in the Last Year: Never true    Ran Out of Food in the Last Year: Never true  Transportation Needs: No Transportation Needs (09/12/2020)   PRAPARE - Hydrologist (Medical): No    Lack of Transportation (Non-Medical): No  Physical Activity: Not on file  Stress: Not on file  Social Connections: Not on file  Intimate Partner  Violence: Not on file    Family History  Problem Relation Age of Onset   Hypertension Mother    Alcohol abuse Neg Hx    Arthritis Neg Hx    Birth defects Neg Hx    Asthma Neg Hx    Cancer Neg Hx    COPD Neg Hx    Diabetes Neg Hx    Depression Neg Hx    Drug abuse Neg Hx    Early death Neg Hx    Hearing loss Neg Hx  Heart disease Neg Hx    Hyperlipidemia Neg Hx    Kidney disease Neg Hx    Learning disabilities Neg Hx    Mental illness Neg Hx    Mental retardation Neg Hx    Miscarriages / Stillbirths Neg Hx    Stroke Neg Hx    Vision loss Neg Hx       Patricia Villanueva, Dorian Pod, MD 05/29/22 1041

## 2022-06-17 ENCOUNTER — Ambulatory Visit (INDEPENDENT_AMBULATORY_CARE_PROVIDER_SITE_OTHER): Payer: Medicaid Other | Admitting: Medical

## 2022-06-17 ENCOUNTER — Other Ambulatory Visit: Payer: Self-pay

## 2022-06-17 VITALS — BP 114/79 | HR 90 | Wt 207.0 lb

## 2022-06-17 DIAGNOSIS — Z0000 Encounter for general adult medical examination without abnormal findings: Secondary | ICD-10-CM

## 2022-06-17 DIAGNOSIS — Z3042 Encounter for surveillance of injectable contraceptive: Secondary | ICD-10-CM

## 2022-06-17 LAB — POCT PREGNANCY, URINE: Preg Test, Ur: NEGATIVE

## 2022-06-17 MED ORDER — MEDROXYPROGESTERONE ACETATE 150 MG/ML IM SUSP
150.0000 mg | Freq: Once | INTRAMUSCULAR | Status: AC
  Administered 2022-06-17: 150 mg via INTRAMUSCULAR

## 2022-06-17 NOTE — Progress Notes (Signed)
iPt here today for UPT before restarting Depo Provera injections. Missed window from last Depo 05/23/22. Its been 17 weeks since last Depo Injection.  Patricia Villanueva here for Depo-Provera Injection. Injection administered without complication. Patient will return in 3 months for next injection between Dec 4 and 18, 2023. Next annual visit due Sept 2024. Was seen today for renewal of Depo injection by Kerry Hough, PA-C.   Georgia Lopes, RN 06/17/2022  3:24 PM

## 2022-06-17 NOTE — Progress Notes (Signed)
  History:  Ms. NAVIE LAMOREAUX is a 31 y.o. [phone] who presents to clinic today for depo provera and is due for annual exam. Patient states some spotting when due for Depo, but otherwise no bleeding. She is happy with her Depo and would like to continue. She is sexually active and uses condoms. She denies abnormal discharge, bleeding or pain today.    The following portions of the patient's history were reviewed and updated as appropriate: allergies, current medications, family history, past medical history, social history, past surgical history and problem list.  Review of Systems:  Review of Systems  Constitutional:  Negative for fever and malaise/fatigue.  Gastrointestinal:  Negative for abdominal pain.  Genitourinary:        Neg - vaginal bleeding, discharge, pelvic pain      Objective:  Physical Exam BP 114/79   Pulse 90   Wt 207 lb (93.9 kg)   LMP  (LMP Unknown)   BMI 31.47 kg/m  Physical Exam Vitals and nursing note reviewed. Exam conducted with a chaperone present.  Constitutional:      General: She is not in acute distress.    Appearance: Normal appearance. She is well-developed and normal weight.  HENT:     Head: Normocephalic and atraumatic.  Cardiovascular:     Rate and Rhythm: Normal rate and regular rhythm.     Heart sounds: No murmur heard. Pulmonary:     Effort: Pulmonary effort is normal. No respiratory distress.     Breath sounds: Normal breath sounds. No wheezing.  Abdominal:     General: Abdomen is flat. Bowel sounds are normal. There is no distension.     Palpations: Abdomen is soft. There is no mass.     Tenderness: There is no abdominal tenderness. There is no guarding or rebound.  Skin:    General: Skin is warm and dry.     Findings: No erythema.  Neurological:     Mental Status: She is alert and oriented to person, place, and time.  Psychiatric:        Mood and Affect: Mood normal.       Labs and Imaging Results for orders placed or  performed in visit on 06/17/22 (from the past 24 hour(s))  Pregnancy, urine POC     Status: None   Collection Time: 06/17/22  3:41 PM  Result Value Ref Range   Preg Test, Ur NEGATIVE NEGATIVE    Health Maintenance Due  Topic Date Due   Hepatitis C Screening  Never done    Labs, imaging and previous visits in Epic reviewed  Assessment & Plan:  1. Encounter for surveillance of injectable contraceptive - Depo today, return in 3 months for next injection   2. Visit for annual health examination  Patient to return for next annual exam in 1 year Next pap smear due 2024  Danielle Rankin 06/17/2022 4:43 PM

## 2022-06-17 NOTE — Addendum Note (Signed)
Addended by: Luvenia Redden on: 06/17/2022 04:45 PM   Modules accepted: Level of Service

## 2022-08-14 IMAGING — DX DG ANKLE COMPLETE 3+V*R*
3 series · 3 of 3 positions shown · non-contrast
Comparison: None.

CLINICAL DATA: Right ankle pain.

EXAM:
RIGHT ANKLE - COMPLETE 3+ VIEW

[ankle ap]
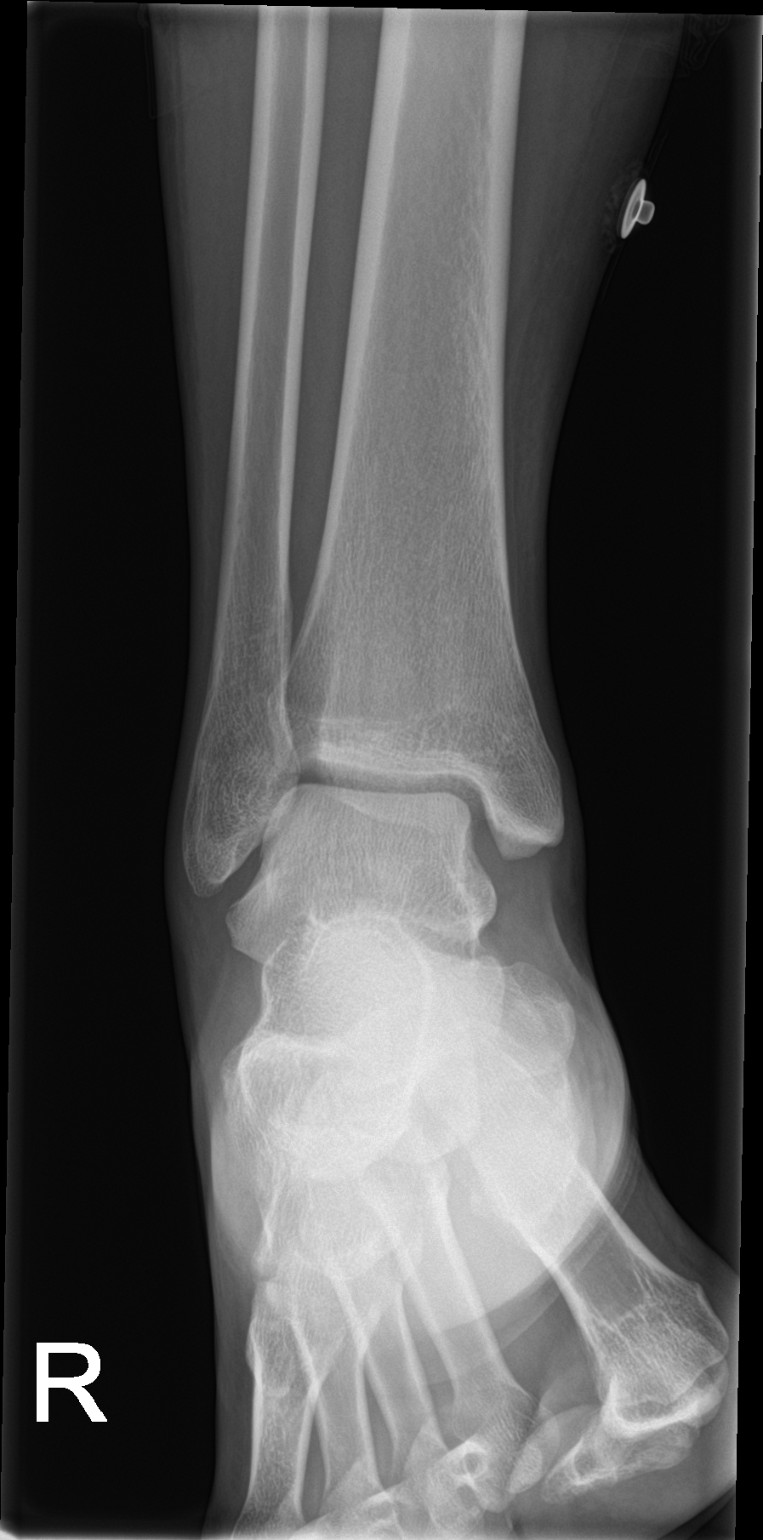

[ankle obl]
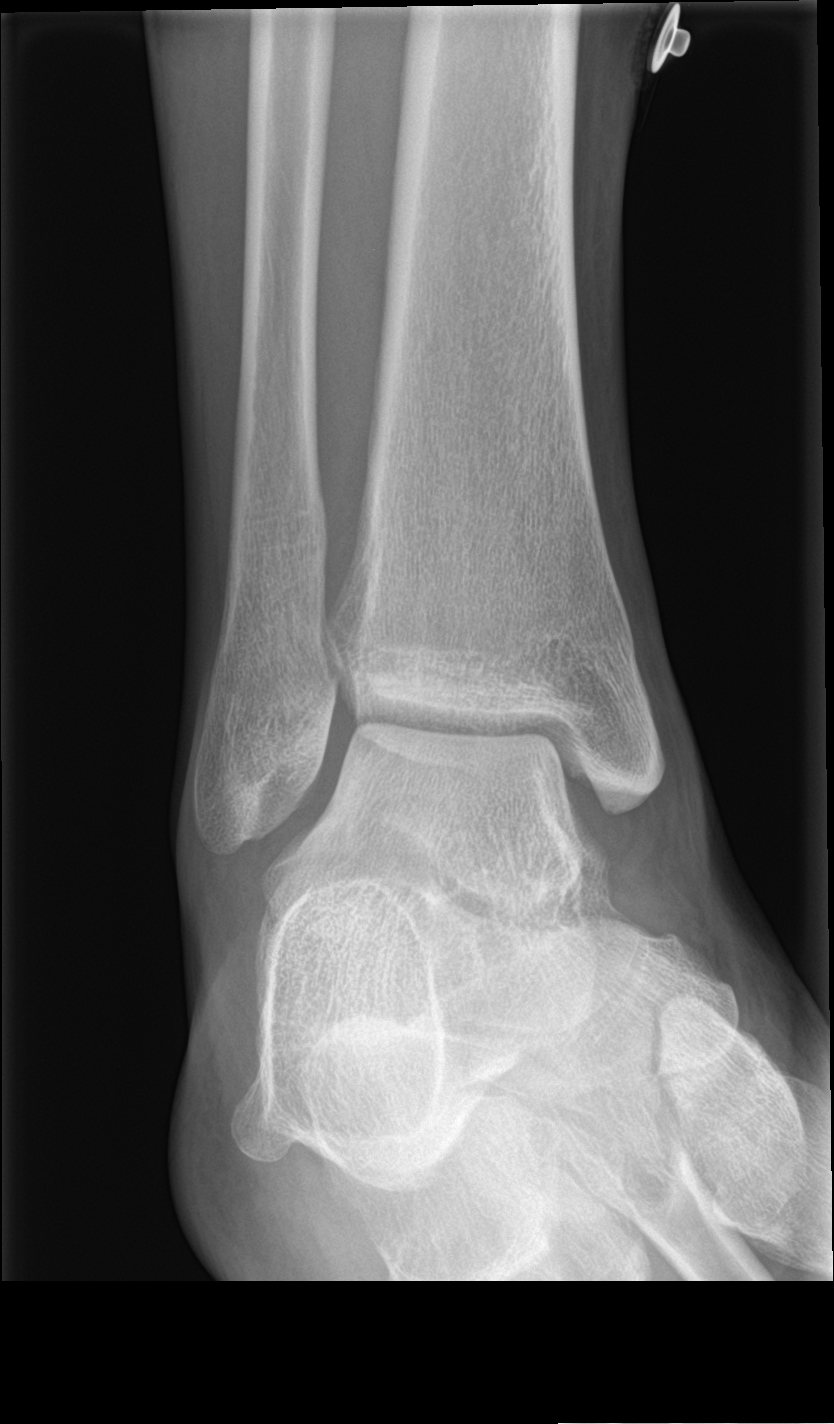

[ankle lat]
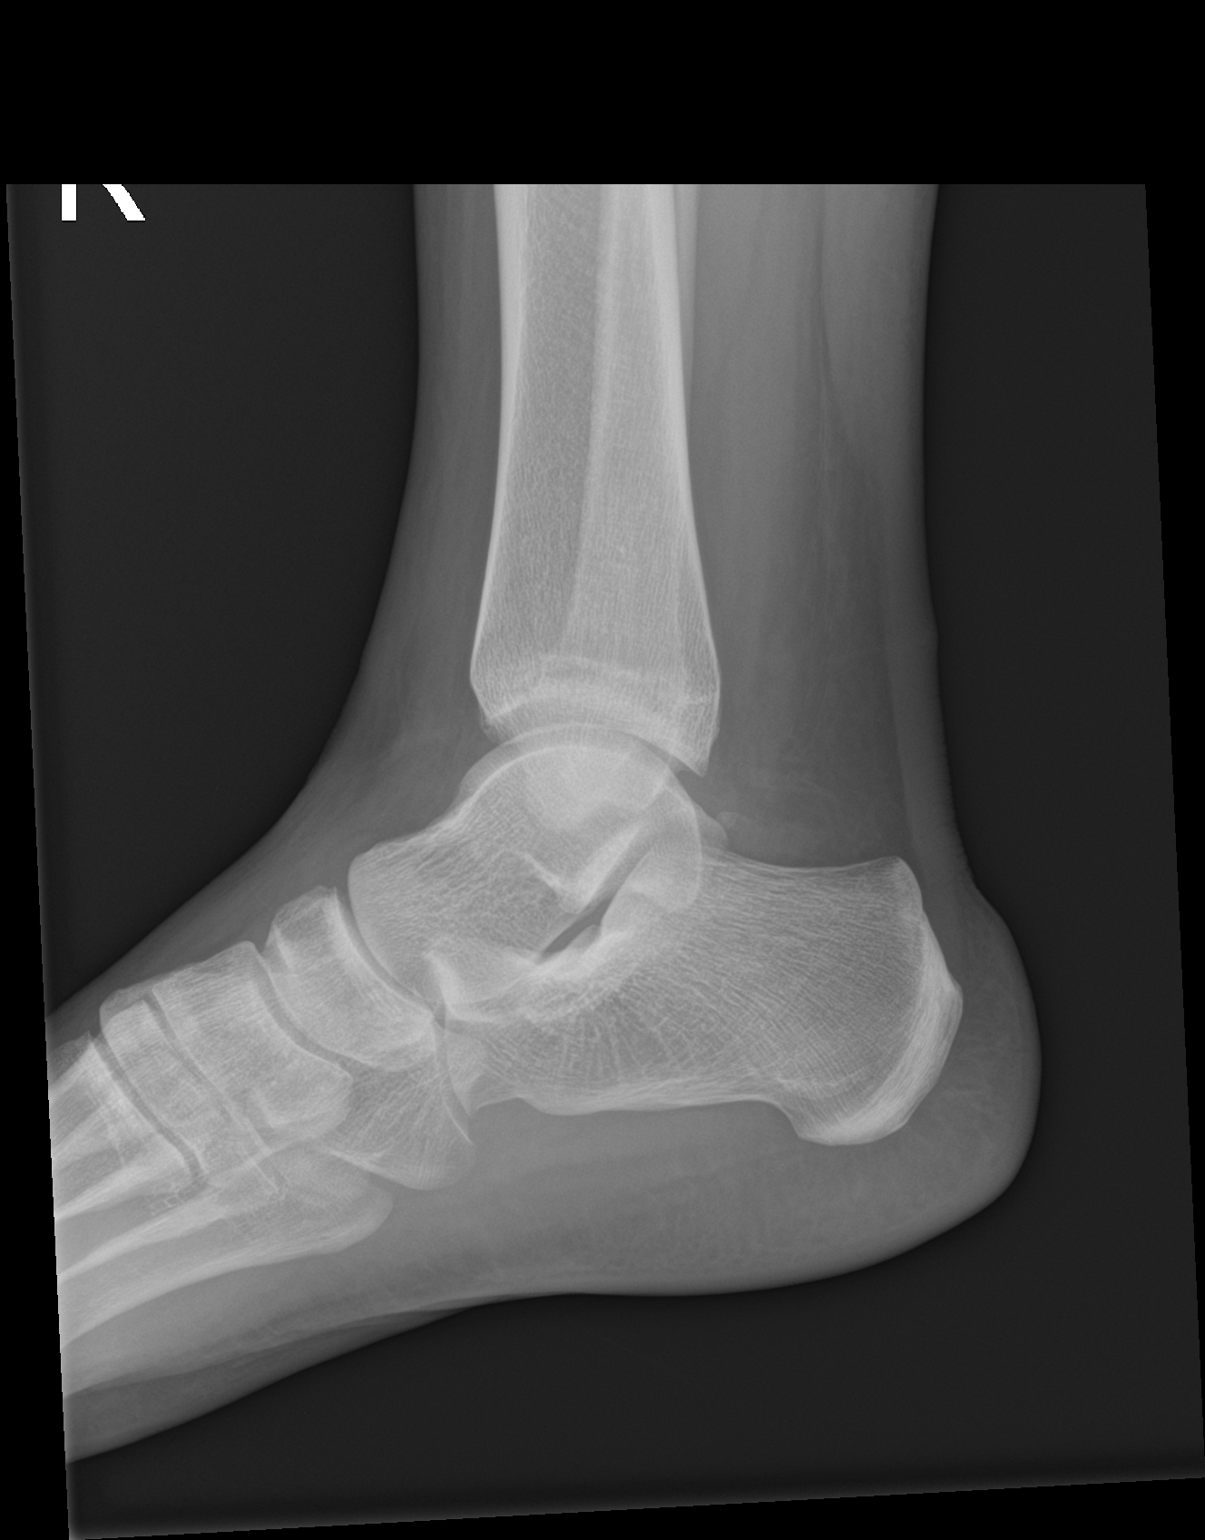

[3 of 3 positions shown; findings below may reference images not displayed]

FINDINGS: There is no evidence of fracture, dislocation, or joint effusion.
There is no evidence of arthropathy or other focal bone abnormality.
Soft tissues are unremarkable.
IMPRESSION: Negative.

## 2022-09-02 ENCOUNTER — Other Ambulatory Visit: Payer: Self-pay

## 2022-09-02 ENCOUNTER — Ambulatory Visit (INDEPENDENT_AMBULATORY_CARE_PROVIDER_SITE_OTHER): Payer: Medicaid Other

## 2022-09-02 DIAGNOSIS — Z3042 Encounter for surveillance of injectable contraceptive: Secondary | ICD-10-CM | POA: Diagnosis not present

## 2022-09-02 MED ORDER — MEDROXYPROGESTERONE ACETATE 150 MG/ML IM SUSP
150.0000 mg | Freq: Once | INTRAMUSCULAR | Status: AC
  Administered 2022-09-02: 150 mg via INTRAMUSCULAR

## 2022-09-02 NOTE — Progress Notes (Signed)
Patricia Villanueva here for Depo-Provera Injection. Injection administered without complication. Patient will return in 3 months for next injection between 02/19 and 03/05. Next annual visit due September 2024.   Janeece Agee, RN 09/02/2022  3:28 PM

## 2022-09-26 ENCOUNTER — Other Ambulatory Visit: Payer: Self-pay

## 2022-09-26 ENCOUNTER — Encounter (HOSPITAL_COMMUNITY): Payer: Self-pay | Admitting: Emergency Medicine

## 2022-09-26 ENCOUNTER — Ambulatory Visit (HOSPITAL_COMMUNITY)
Admission: EM | Admit: 2022-09-26 | Discharge: 2022-09-26 | Disposition: A | Discharge status: Home / Self Care | Payer: Medicaid Other | Attending: Internal Medicine | Admitting: Internal Medicine

## 2022-09-26 ENCOUNTER — Emergency Department (HOSPITAL_COMMUNITY)
Admission: EM | Admit: 2022-09-26 | Discharge: 2022-09-26 | Discharge status: Against Medical Advice | Payer: Medicaid Other | Attending: Emergency Medicine | Admitting: Emergency Medicine

## 2022-09-26 DIAGNOSIS — M79672 Pain in left foot: Secondary | ICD-10-CM | POA: Insufficient documentation

## 2022-09-26 DIAGNOSIS — B070 Plantar wart: Secondary | ICD-10-CM | POA: Diagnosis not present

## 2022-09-26 DIAGNOSIS — Z5321 Procedure and treatment not carried out due to patient leaving prior to being seen by health care provider: Secondary | ICD-10-CM | POA: Diagnosis not present

## 2022-09-26 DIAGNOSIS — J069 Acute upper respiratory infection, unspecified: Secondary | ICD-10-CM

## 2022-09-26 LAB — CBG MONITORING, ED
Glucose-Capillary: >600 mg/dL (ref 70–99)
Glucose-Capillary: 113 mg/dL — ABNORMAL HIGH (ref 70–99)

## 2022-09-26 MED ORDER — PODOPHYLLUM RESIN 25 % EX SOLN: Freq: Once | CUTANEOUS | Status: DC

## 2022-09-26 MED ORDER — PODOPHYLLUM RESIN 25 % EX SOLN: CUTANEOUS | 0 refills | Status: DC

## 2022-09-26 MED ORDER — BENZONATATE 100 MG PO CAPS: 100.0000 mg | ORAL_CAPSULE | Freq: Three times a day (TID) | ORAL | 0 refills | Status: DC | PRN

## 2022-09-26 NOTE — ED Provider Notes (Signed)
[facility]    CSN: [id number] Arrival date & time: 09/26/22  1200      History   Chief Complaint Chief Complaint  Patient presents with   Foot Pain    HPI Patricia Villanueva is a 31 y.o. female with no past medical history comes to urgent care with right foot pain.  Patient's foot pain has been ongoing for past few weeks.  She complains of warts on the plantar surface of the left foot.  Pain is constant, throbbing and denies any relieving factors.  She has not tried any over-the-counter medications.  No discharge or swelling.  Pain is worse when she walks.  No swelling or redness of the left foot.  Patient complains of numbness of the left great toe.  No swelling or redness.  Patient denies any polyuria polydipsia, polyphagia or weight loss.  Patient went to the emergency department last night but left before provider evaluated her.  HPI  Past Medical History:  Diagnosis Date   Chlamydia    Medical history non-contributory     Patient Active Problem List   Diagnosis Date Noted   Alpha thalassemia silent carrier 11/11/2019   History of COVID-19 10/08/2019   History of irregular menstrual cycles 02/13/2013    Past Surgical History:  Procedure Laterality Date   arm surgery     left arm surgery to repair a nerve   arm surgery     DENTAL SURGERY     NERVE EXPLORATION  04/04/2012   Procedure: NERVE EXPLORATION;  Surgeon: Sharma Covert, MD;  Location: [facility];  Service: Orthopedics;  Laterality: Left;  Left Arm Laceration with Nerve Repair    OB History     Gravida  3   Para  3   Term  3   Preterm  0   AB  0   Living  3      SAB  0   IAB  0   Ectopic  0   Multiple  0   Live Births  3            Home Medications    Prior to Admission medications   Not on File    Family History Family History  Problem Relation Age of Onset   Hypertension Mother    Alcohol abuse Neg Hx    Arthritis Neg Hx    Birth defects Neg Hx    Asthma Neg Hx     Cancer Neg Hx    COPD Neg Hx    Diabetes Neg Hx    Depression Neg Hx    Drug abuse Neg Hx    Early death Neg Hx    Hearing loss Neg Hx    Heart disease Neg Hx    Hyperlipidemia Neg Hx    Kidney disease Neg Hx    Learning disabilities Neg Hx    Mental illness Neg Hx    Mental retardation Neg Hx    Miscarriages / Stillbirths Neg Hx    Stroke Neg Hx    Vision loss Neg Hx     Social History Social History   Tobacco Use   Smoking status: Never   Smokeless tobacco: Never  Vaping Use   Vaping Use: Never used  Substance Use Topics   Alcohol use: Yes    Comment: occasionally   Drug use: Not Currently    Types: Marijuana     Allergies   Patient has no known allergies.   Review of  Systems Review of Systems As per HPI.  Physical Exam Triage Vital Signs ED Triage Vitals [09/26/22 1534]  Enc Vitals Group     BP (!) 124/90     Pulse Rate 86     Resp 20     Temp 98.9 F (37.2 C)     Temp Source Oral     SpO2 100 %     Weight      Height      Head Circumference      Peak Flow      Pain Score      Pain Loc      Pain Edu?      Excl. in [place]?    No data found.  Updated Vital Signs BP (!) 124/90 (BP Location: Right Arm) Comment (BP Location): large cuff  Pulse 86   Temp 98.9 F (37.2 C) (Oral)   Resp 20   SpO2 100%   Visual Acuity Right Eye Distance:   Left Eye Distance:   Bilateral Distance:    Right Eye Near:   Left Eye Near:    Bilateral Near:     Physical Exam Constitutional:      Appearance: Normal appearance.  Cardiovascular:     Rate and Rhythm: Normal rate and regular rhythm.     Pulses: Normal pulses.     Heart sounds: Normal heart sounds.  Musculoskeletal:        General: Normal range of motion.     Comments: Wart on the plantar surface of the left foot.  Tender to palpation.  No fluctuance.  Measures about 1 inch in the longest diameter.  Firm in consistency.  Skin:    General: Skin is warm.  Neurological:     Mental Status: She is  alert.      UC Treatments / Results  Labs (all labs ordered are listed, but only abnormal results are displayed) Labs Reviewed  CBG MONITORING, ED - Abnormal; Notable for the following components:      Result Value   Glucose-Capillary 113 (*)    All other components within normal limits    EKG   Radiology No results found.  Procedures Procedures (including critical care time)  Medications Ordered in UC Medications  podophyllum resin (PODOCON-25) 25 % external solution (has no administration in time range)    Initial Impression / Assessment and Plan / UC Course  I have reviewed the triage vital signs and the nursing notes.  Pertinent labs & imaging results that were available during my care of the patient were reviewed by me and considered in my medical decision making (see chart for details).     1.  Plantar warts: Podophyllin external solution weekly for 3 weeks Shoe inserts to help with pain Ibuprofen as needed for pain and/or fever Return to urgent care if symptoms worsen.  Final Clinical Impressions(s) / UC Diagnoses   Final diagnoses:  Plantar warts     Discharge Instructions      Shoe inserts Please apply medication directly on the warts.  Allow the external solution to dry. Ibuprofen as needed for pain and/or fever Avoid shaving towards. Return to urgent care if symptoms worsen.     ED Prescriptions   None    PDMP not reviewed this encounter.   Chase Picket, MD 09/26/22 [phone]

## 2022-09-26 NOTE — ED Notes (Signed)
[facility] has called and asked for this patient to be d/c'd because she has left this ED and has checked in there

## 2022-09-26 NOTE — ED Notes (Signed)
Patient placed in the intake room.  Patient went to ED initially.  Noted elevated CBG documented in medical record.    Briefly spoke to patient .  Patient complains about a recurrent wart to middle of foot.  Wart randomly gets filed.  Site does not look bad.  Patient comments that her great to is numb.  Asked about medications.  States not on any medicines for her foot.  Specifically asked if taking diabetic medicine, states she is not diabetic.    Notified dr Leonides Grills

## 2022-09-26 NOTE — ED Triage Notes (Signed)
Pt. Stated, Patricia Villanueva had this area of my foot hurting for about 6 months and now itds to the point I can't hardly walk on it.  The bottom of pt's foot looks like a nail stick on the bottom of the left foot. This started 6 months ago.

## 2022-09-26 NOTE — Discharge Instructions (Addendum)
Shoe inserts to help with pain Please apply medication directly on the warts.  Allow the external solution to dry. Ibuprofen as needed for pain and/or fever Avoid shaving towards. Return to urgent care if symptoms worsen.

## 2022-11-18 ENCOUNTER — Ambulatory Visit: Payer: Medicaid Other

## 2022-12-02 DIAGNOSIS — A084 Viral intestinal infection, unspecified: Secondary | ICD-10-CM | POA: Diagnosis not present

## 2022-12-16 ENCOUNTER — Ambulatory Visit (INDEPENDENT_AMBULATORY_CARE_PROVIDER_SITE_OTHER): Payer: Medicaid Other | Admitting: *Deleted

## 2022-12-16 ENCOUNTER — Other Ambulatory Visit: Payer: Self-pay

## 2022-12-16 VITALS — BP 125/88 | HR 91 | Ht 67.0 in | Wt 197.4 lb

## 2022-12-16 DIAGNOSIS — Z3042 Encounter for surveillance of injectable contraceptive: Secondary | ICD-10-CM

## 2022-12-16 LAB — POCT PREGNANCY, URINE: Preg Test, Ur: NEGATIVE

## 2022-12-16 MED ORDER — MEDROXYPROGESTERONE ACETATE 150 MG/ML IM SUSP
150.0000 mg | Freq: Once | INTRAMUSCULAR | Status: AC
  Administered 2022-12-16: 150 mg via INTRAMUSCULAR

## 2022-12-16 NOTE — Progress Notes (Signed)
Here to restart depoprovera . It has been 15 weeks since last depoprovera. She reports she used condoms with last intercourse around 12/06/22.  Today UPT negative. Per protocol with negative upt and no unprotected intercourse, may restart depo-provera. I advised her she will need to use back up method for 2 weeks. I also advised she needs her pap smear before or same day as next injection. Injection given without complaint. Sent to resgistrar to schedule papsmear and next depo-provera. She voices understanding. Staci Acosta

## 2022-12-24 DIAGNOSIS — R1110 Vomiting, unspecified: Secondary | ICD-10-CM | POA: Diagnosis not present

## 2022-12-24 DIAGNOSIS — R197 Diarrhea, unspecified: Secondary | ICD-10-CM | POA: Diagnosis not present

## 2022-12-24 DIAGNOSIS — Z3202 Encounter for pregnancy test, result negative: Secondary | ICD-10-CM | POA: Diagnosis not present

## 2023-01-30 ENCOUNTER — Ambulatory Visit: Payer: Medicaid Other | Admitting: Obstetrics and Gynecology

## 2023-03-03 ENCOUNTER — Ambulatory Visit: Payer: Medicaid Other

## 2023-04-17 ENCOUNTER — Ambulatory Visit (INDEPENDENT_AMBULATORY_CARE_PROVIDER_SITE_OTHER): Payer: Medicaid Other | Admitting: *Deleted

## 2023-04-17 ENCOUNTER — Other Ambulatory Visit: Payer: Self-pay

## 2023-04-17 VITALS — BP 129/83 | HR 79 | Ht 68.0 in | Wt 199.6 lb

## 2023-04-17 DIAGNOSIS — Z3042 Encounter for surveillance of injectable contraceptive: Secondary | ICD-10-CM | POA: Diagnosis not present

## 2023-04-17 LAB — POCT PREGNANCY, URINE: Preg Test, Ur: NEGATIVE

## 2023-04-17 MED ORDER — MEDROXYPROGESTERONE ACETATE 150 MG/ML IM SUSP
150.0000 mg | Freq: Once | INTRAMUSCULAR | Status: AC
  Administered 2023-04-17: 150 mg via INTRAMUSCULAR

## 2023-04-17 NOTE — Progress Notes (Signed)
Pt presents for re-start of Depo Provera.  Last injection was [redacted]w[redacted]d ago (12/16/22). She reports last unprotected intercourse was greater than 2 weeks ago. Urine pregnancy test today is negative. Per standing order, Depo Provera 150 mg IM was administered. Pt tolerated well. She was instructed to use back up contraception method for the next 2 weeks. Next dose is due 10/3-10/17.  Last pap done 11/01/19 - normal. Pt will schedule annual gyn exam w/Pap upon check-out today.

## 2023-05-22 ENCOUNTER — Ambulatory Visit: Payer: Medicaid Other | Admitting: Obstetrics and Gynecology

## 2023-06-18 ENCOUNTER — Ambulatory Visit: Payer: Medicaid Other | Admitting: Obstetrics and Gynecology

## 2023-07-03 ENCOUNTER — Ambulatory Visit: Payer: Medicaid Other

## 2023-11-26 ENCOUNTER — Ambulatory Visit: Payer: Medicaid Other | Admitting: Obstetrics and Gynecology

## 2024-01-20 ENCOUNTER — Other Ambulatory Visit (HOSPITAL_COMMUNITY)
Admission: RE | Admit: 2024-01-20 | Discharge: 2024-01-20 | Disposition: A | Discharge status: Home / Self Care | Source: Ambulatory Visit | Attending: Obstetrics and Gynecology | Admitting: Obstetrics and Gynecology

## 2024-01-20 ENCOUNTER — Ambulatory Visit: Admitting: Obstetrics and Gynecology

## 2024-01-20 ENCOUNTER — Other Ambulatory Visit: Payer: Self-pay

## 2024-01-20 ENCOUNTER — Encounter: Payer: Self-pay | Admitting: Obstetrics and Gynecology

## 2024-01-20 VITALS — BP 152/95 | HR 77 | Ht 68.0 in | Wt 224.0 lb

## 2024-01-20 DIAGNOSIS — Z124 Encounter for screening for malignant neoplasm of cervix: Secondary | ICD-10-CM

## 2024-01-20 DIAGNOSIS — Z01419 Encounter for gynecological examination (general) (routine) without abnormal findings: Secondary | ICD-10-CM

## 2024-01-20 DIAGNOSIS — Z113 Encounter for screening for infections with a predominantly sexual mode of transmission: Secondary | ICD-10-CM | POA: Diagnosis not present

## 2024-01-20 DIAGNOSIS — Z3202 Encounter for pregnancy test, result negative: Secondary | ICD-10-CM | POA: Diagnosis not present

## 2024-01-20 DIAGNOSIS — Z3042 Encounter for surveillance of injectable contraceptive: Secondary | ICD-10-CM | POA: Diagnosis not present

## 2024-01-20 LAB — POCT PREGNANCY, URINE: Preg Test, Ur: NEGATIVE

## 2024-01-20 MED ORDER — MEDROXYPROGESTERONE ACETATE 150 MG/ML IM SUSP
150.0000 mg | Freq: Once | INTRAMUSCULAR | Status: AC
  Administered 2024-01-20: 150 mg via INTRAMUSCULAR

## 2024-01-20 NOTE — Patient Instructions (Signed)
Use the following websites (and others) to help learn more about your contraception options and find the method that is right for you!  - The Centers for Disease Control (CDC) website: https://www.cdc.gov/reproductivehealth/contraception/index.htm  - [facility] website: https://www.[facility].org/learn/birth-control  - [employer]: https://www.[employer].org/methods  

## 2024-01-20 NOTE — Addendum Note (Signed)
 Addended by: Humberto Magnus on: 01/20/2024 03:58 PM   Modules accepted: Orders

## 2024-01-20 NOTE — Progress Notes (Signed)
 GYNECOLOGY ANNUAL PREVENTATIVE CARE ENCOUNTER NOTE  Subjective:   Patricia Villanueva is a 33 y.o. G10P3003 female here for a annual gynecologic exam. Current complaints: needs annual.  Wants to get back on depo.  Very occasional bleeding with depo. Denies abnormal vaginal bleeding, discharge, pelvic pain, problems with intercourse or other gynecologic concerns. Requests STI screen.   Wants to restart depo, had protected sex in the last two weeks.  Gynecologic History Patient's last menstrual period was 01/05/2024. Contraception: none Last Pap: 2021. Results: normal Last mammogram: n/a Gardisil: unsure but probably has received  Obstetric History OB History  Gravida Para Term Preterm AB Living  3 3 3  0 0 3  SAB IAB Ectopic Multiple Live Births  0 0 0 0 3    # Outcome Date GA Lbr Len/2nd Weight Sex Type Anes PTL Lv  3 Term 05/11/20 [redacted]w[redacted]d 02:11 / 00:04 7 lb 10 oz (3.459 kg) M Vag-Spont EPI  LIV  2 Term 04/15/17 [redacted]w[redacted]d 03:17 / 00:05 7 lb 6.3 oz (3.354 kg) F Vag-Spont EPI  LIV     Birth Comments: wnl  1 Term 11/04/13 [redacted]w[redacted]d 10:02 / 00:44 6 lb 14.9 oz (3.144 kg) F Vag-Spont EPI  LIV     Birth Comments: Birth mark on back   concerns with baby abdomen on prenatal us  smaller than expected   Past Medical History:  Diagnosis Date   Chlamydia    Medical history non-contributory    Past Surgical History:  Procedure Laterality Date   arm surgery     left arm surgery to repair a nerve   arm surgery     DENTAL SURGERY     NERVE EXPLORATION  04/04/2012   Procedure: NERVE EXPLORATION;  Surgeon: Shellie Dials, MD;  Location: [facility] OR;  Service: Orthopedics;  Laterality: Left;  Left Arm Laceration with Nerve Repair    Current Outpatient Medications on File Prior to Visit  Medication Sig Dispense Refill   benzonatate  (TESSALON ) 100 MG capsule Take 1 capsule (100 mg total) by mouth 3 (three) times daily as needed for cough. (Patient not taking: Reported on 01/20/2024) 21 capsule 0   ondansetron   (ZOFRAN ) 4 MG tablet Take 4 mg by mouth every 8 (eight) hours as needed for nausea. (Patient not taking: Reported on 01/20/2024)     podophyllum resin  (PODOCON-25) 25 % SOLN Please use a Q-tip to apply the solution directly on the warts.  Please allow it to dry.  Please apply weekly for 6 weeks. (Patient not taking: Reported on 01/20/2024) 15 mL 0   No current facility-administered medications on file prior to visit.    No Known Allergies  Social History   Socioeconomic History   Marital status: Single    Spouse name: Not on file   Number of children: Not on file   Years of education: Not on file   Highest education level: Not on file  Occupational History   Not on file  Tobacco Use   Smoking status: Never   Smokeless tobacco: Never  Vaping Use   Vaping status: Never Used  Substance and Sexual Activity   Alcohol use: Yes    Comment: occasionally   Drug use: Not Currently    Types: Marijuana   Sexual activity: Yes    Birth control/protection: Injection  Other Topics Concern   Not on file  Social History Narrative   Not on file   Social Drivers of Health   Financial Resource Strain: Not on file  Food Insecurity: No Food Insecurity (01/20/2024)   Hunger Vital Sign    Worried About Running Out of Food in the Last Year: Never true    Ran Out of Food in the Last Year: Never true  Transportation Needs: No Transportation Needs (01/20/2024)   PRAPARE - Administrator, Civil Service (Medical): No    Lack of Transportation (Non-Medical): No  Physical Activity: Not on file  Stress: Not on file  Social Connections: Not on file  Intimate Partner Violence: Not on file    Family History  Problem Relation Age of Onset   Hypertension Mother    Alcohol abuse Neg Hx    Arthritis Neg Hx    Birth defects Neg Hx    Asthma Neg Hx    Cancer Neg Hx    COPD Neg Hx    Diabetes Neg Hx    Depression Neg Hx    Drug abuse Neg Hx    Early death Neg Hx    Hearing loss Neg Hx     Heart disease Neg Hx    Hyperlipidemia Neg Hx    Kidney disease Neg Hx    Learning disabilities Neg Hx    Mental illness Neg Hx    Mental retardation Neg Hx    Miscarriages / Stillbirths Neg Hx    Stroke Neg Hx    Vision loss Neg Hx      The following portions of the patient's history were reviewed and updated as appropriate: allergies, current medications, past family history, past medical history, past social history, past surgical history and problem list.  Review of Systems Pertinent items are noted in HPI.   Objective:  BP (!) 157/109   Pulse 86   Ht 5\' 8"  (1.727 m)   Wt 224 lb (101.6 kg)   LMP 01/05/2024   BMI 34.06 kg/m  CONSTITUTIONAL: Well-developed, well-nourished female in no acute distress.  HENT:  Normocephalic, atraumatic, External right and left ear normal. Oropharynx is clear and moist EYES: Conjunctivae and EOM are normal. Pupils are equal, round, and reactive to light. No scleral icterus.  NECK: Normal range of motion, supple, no masses.  Normal thyroid.  SKIN: Skin is warm and dry. No rash noted. Not diaphoretic. No erythema. No pallor. NEUROLOGIC: Alert and oriented to person, place, and time. Normal reflexes, muscle tone coordination. No cranial nerve deficit noted. PSYCHIATRIC: Normal mood and affect. Normal behavior. Normal judgment and thought content. CARDIOVASCULAR: Normal heart rate noted RESPIRATORY: Effort  normal, no problems with respiration noted. BREASTS: Symmetric in size. No masses, skin changes, nipple drainage, or lymphadenopathy. ABDOMEN: Soft, no distention noted.  No tenderness, rebound or guarding.  PELVIC: Normal appearing external genitalia; normal appearing vaginal mucosa and cervix.  No abnormal discharge noted.  Pap smear obtained. Pelvic cultures obtained. Normal uterine size, no other palpable masses, no uterine or adnexal tenderness. MUSCULOSKELETAL: Normal range of motion. No tenderness.  No cyanosis, clubbing, or edema. .  Exam  done with chaperone present.  Assessment and Plan:   1. Encounter for Papanicolaou smear for cervical cancer screening (Primary) - Cytology - PAP( [place])  2. Screening examination for STD (sexually transmitted disease) - Cervicovaginal ancillary only( [place]) - RPR+HBsAg+HCVAb+...  3. Well woman exam Son diagnosed with Wilms tumor Otherwise feeling well Wants full check up  4. Depo-Provera  contraceptive status Wants to restart depo Gave info for alternative options   Will follow up results of pap smear/STI screen and manage accordingly. Encouraged improvement  in diet and exercise.  Requests STI screen. Mammogram n/a Referral for colonoscopy n/a Kip Peon will checdk to see if she had  Routine preventative health maintenance measures emphasized. Please refer to After Visit Summary for other counseling recommendations.     Larence Pleas, MD, [facility] Attending Center for [employer] [facility])

## 2024-01-21 LAB — CERVICOVAGINAL ANCILLARY ONLY
Bacterial Vaginitis (gardnerella): NEGATIVE
Candida Glabrata: NEGATIVE
Candida Vaginitis: NEGATIVE
Chlamydia: NEGATIVE
Comment: NEGATIVE
Comment: NEGATIVE
Comment: NEGATIVE
Comment: NEGATIVE
Comment: NEGATIVE
Comment: NORMAL
Neisseria Gonorrhea: NEGATIVE
Trichomonas: NEGATIVE

## 2024-01-22 LAB — CYTOLOGY - PAP
Comment: NEGATIVE
Diagnosis: NEGATIVE
High risk HPV: NEGATIVE

## 2024-01-22 LAB — RPR+HBSAG+HCVAB+...
HIV Screen 4th Generation wRfx: NONREACTIVE
Hep C Virus Ab: NONREACTIVE
Hepatitis B Surface Ag: NEGATIVE
RPR Ser Ql: NONREACTIVE

## 2024-01-26 ENCOUNTER — Encounter: Payer: Self-pay | Admitting: Family Medicine

## 2024-03-02 ENCOUNTER — Encounter (HOSPITAL_COMMUNITY): Payer: Self-pay | Admitting: *Deleted

## 2024-03-02 ENCOUNTER — Emergency Department (HOSPITAL_COMMUNITY)
Admission: EM | Admit: 2024-03-02 | Discharge: 2024-03-02 | Discharge status: Against Medical Advice | Attending: Emergency Medicine | Admitting: Emergency Medicine

## 2024-03-02 ENCOUNTER — Other Ambulatory Visit: Payer: Self-pay

## 2024-03-02 ENCOUNTER — Emergency Department (HOSPITAL_COMMUNITY)

## 2024-03-02 DIAGNOSIS — R220 Localized swelling, mass and lump, head: Secondary | ICD-10-CM | POA: Insufficient documentation

## 2024-03-02 DIAGNOSIS — Z5321 Procedure and treatment not carried out due to patient leaving prior to being seen by health care provider: Secondary | ICD-10-CM | POA: Diagnosis not present
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
## 2024-03-02 NOTE — ED Notes (Signed)
 Patient back from CT and states she is leaving and she will see her results in her "email". Asked if she was leaving and she states yes. Pt, alert, oriented, and ambulatory at time she is leaving with her mother.

## 2024-03-02 NOTE — ED Notes (Signed)
 Pt mother asking if they would be going back for Cts soon. CT called and they arrived at this time to get her to take her for her Cts.

## 2024-05-05 ENCOUNTER — Ambulatory Visit

## 2024-05-05 ENCOUNTER — Other Ambulatory Visit: Payer: Self-pay

## 2024-05-05 VITALS — BP 132/89 | HR 92 | Ht 68.0 in | Wt 218.0 lb

## 2024-05-05 DIAGNOSIS — Z3042 Encounter for surveillance of injectable contraceptive: Secondary | ICD-10-CM

## 2024-05-05 LAB — POCT PREGNANCY, URINE: Preg Test, Ur: NEGATIVE

## 2024-05-05 MED ORDER — MEDROXYPROGESTERONE ACETATE 150 MG/ML IM SUSY
150.0000 mg | PREFILLED_SYRINGE | Freq: Once | INTRAMUSCULAR | Status: AC
  Administered 2024-05-05: 150 mg via INTRAMUSCULAR

## 2024-05-05 NOTE — Progress Notes (Signed)
 Lazarus ONEIDA Mulch here for Depo-Provera  Injection. It has been 15.1 weeks since last injection. UPT obtained and resulted negative. Patient reported that it has been greater than 2 weeks since last time she had unprotected intercourse. Per protocol, patient meets the criteria to receive injection today.   Injection administered without complication. Patient will return in 3 months for next injection between October 24th and November 2nd. Next annual visit due 12/2024.    Rosaline Pendleton, RN 05/05/2024  3:28 PM

## 2024-07-26 ENCOUNTER — Ambulatory Visit

## 2024-07-28 ENCOUNTER — Ambulatory Visit

## 2024-08-04 ENCOUNTER — Other Ambulatory Visit: Payer: Self-pay

## 2024-08-04 ENCOUNTER — Ambulatory Visit (INDEPENDENT_AMBULATORY_CARE_PROVIDER_SITE_OTHER): Admitting: *Deleted

## 2024-08-04 VITALS — BP 132/87 | HR 84 | Ht 68.0 in | Wt 221.7 lb

## 2024-08-04 DIAGNOSIS — Z3042 Encounter for surveillance of injectable contraceptive: Secondary | ICD-10-CM | POA: Diagnosis not present

## 2024-08-04 MED ORDER — MEDROXYPROGESTERONE ACETATE 150 MG/ML IM SUSP
150.0000 mg | Freq: Once | INTRAMUSCULAR | Status: AC
  Administered 2024-08-04: 150 mg via INTRAMUSCULAR

## 2024-08-04 NOTE — Progress Notes (Signed)
 Depo Provera  150 mg IM administered as scheduled. Pt tolerated well. Next dose due 1/21-11/03/24. Next Annual Gyn exam due after 01/19/25. Pt voiced understanding.

## 2024-10-20 ENCOUNTER — Ambulatory Visit

## 2024-11-02 ENCOUNTER — Ambulatory Visit

## 2024-11-03 ENCOUNTER — Ambulatory Visit
